# Patient Record
Sex: Female | Born: 1940 | Race: White | Hispanic: No | Marital: Married | Smoking: Never smoker
Health system: Southern US, Community
[De-identification: ages and names within clinical notes are randomized; demographics above are authoritative.]

## PROBLEM LIST (undated history)

## (undated) DIAGNOSIS — C50919 Malignant neoplasm of unspecified site of unspecified female breast: Secondary | ICD-10-CM

## (undated) DIAGNOSIS — Z923 Personal history of irradiation: Secondary | ICD-10-CM

---

## 1999-02-22 DIAGNOSIS — C50919 Malignant neoplasm of unspecified site of unspecified female breast: Secondary | ICD-10-CM

## 1999-02-22 DIAGNOSIS — Z923 Personal history of irradiation: Secondary | ICD-10-CM

## 1999-02-22 HISTORY — DX: Personal history of irradiation: Z92.3

## 1999-02-22 HISTORY — DX: Malignant neoplasm of unspecified site of unspecified female breast: C50.919

## 2003-12-08 ENCOUNTER — Ambulatory Visit: Payer: Self-pay | Admitting: Oncology

## 2004-02-02 ENCOUNTER — Ambulatory Visit: Payer: Self-pay | Admitting: Radiation Oncology

## 2004-06-07 ENCOUNTER — Ambulatory Visit: Payer: Self-pay | Admitting: Oncology

## 2004-11-15 ENCOUNTER — Ambulatory Visit: Payer: Self-pay | Admitting: Surgery

## 2004-12-01 ENCOUNTER — Ambulatory Visit: Payer: Self-pay | Admitting: Oncology

## 2005-05-30 ENCOUNTER — Ambulatory Visit: Payer: Self-pay | Admitting: Oncology

## 2005-12-12 ENCOUNTER — Ambulatory Visit: Payer: Self-pay | Admitting: Internal Medicine

## 2006-07-10 ENCOUNTER — Ambulatory Visit: Payer: Self-pay | Admitting: Specialist

## 2006-07-10 ENCOUNTER — Other Ambulatory Visit: Payer: Self-pay

## 2006-07-20 ENCOUNTER — Inpatient Hospital Stay: Payer: Self-pay | Admitting: Specialist

## 2006-07-25 ENCOUNTER — Encounter: Payer: Self-pay | Admitting: Internal Medicine

## 2007-01-25 ENCOUNTER — Ambulatory Visit: Payer: Self-pay | Admitting: Internal Medicine

## 2008-03-19 ENCOUNTER — Ambulatory Visit: Payer: Self-pay | Admitting: Internal Medicine

## 2008-09-08 ENCOUNTER — Ambulatory Visit: Payer: Self-pay | Admitting: Unknown Physician Specialty

## 2009-04-17 ENCOUNTER — Ambulatory Visit: Payer: Self-pay | Admitting: Internal Medicine

## 2010-05-25 ENCOUNTER — Ambulatory Visit: Payer: Self-pay | Admitting: Internal Medicine

## 2011-07-06 ENCOUNTER — Ambulatory Visit: Payer: Self-pay | Admitting: Internal Medicine

## 2012-07-19 ENCOUNTER — Ambulatory Visit: Payer: Self-pay | Admitting: Internal Medicine

## 2016-11-17 ENCOUNTER — Other Ambulatory Visit: Payer: Self-pay | Admitting: Family Medicine

## 2016-11-17 DIAGNOSIS — Z1231 Encounter for screening mammogram for malignant neoplasm of breast: Secondary | ICD-10-CM

## 2016-11-28 ENCOUNTER — Ambulatory Visit
Admission: RE | Admit: 2016-11-28 | Discharge: 2016-11-28 | Disposition: A | Discharge status: Home / Self Care | Payer: Medicare Other | Source: Ambulatory Visit | Attending: Family Medicine | Admitting: Family Medicine

## 2016-11-28 ENCOUNTER — Encounter: Payer: Self-pay | Admitting: Radiology

## 2016-11-28 ENCOUNTER — Other Ambulatory Visit: Payer: Self-pay | Admitting: Family Medicine

## 2016-11-28 DIAGNOSIS — Z1231 Encounter for screening mammogram for malignant neoplasm of breast: Secondary | ICD-10-CM | POA: Insufficient documentation

## 2016-11-28 HISTORY — DX: Malignant neoplasm of unspecified site of unspecified female breast: C50.919

## 2016-11-28 HISTORY — DX: Personal history of irradiation: Z92.3

## 2017-09-19 ENCOUNTER — Other Ambulatory Visit: Payer: Self-pay | Admitting: Family Medicine

## 2017-09-19 DIAGNOSIS — Z1231 Encounter for screening mammogram for malignant neoplasm of breast: Secondary | ICD-10-CM

## 2017-11-29 ENCOUNTER — Ambulatory Visit
Admission: RE | Admit: 2017-11-29 | Discharge: 2017-11-29 | Disposition: A | Discharge status: Home / Self Care | Payer: Medicare Other | Source: Ambulatory Visit | Attending: Family Medicine | Admitting: Family Medicine

## 2017-11-29 DIAGNOSIS — Z1231 Encounter for screening mammogram for malignant neoplasm of breast: Secondary | ICD-10-CM | POA: Diagnosis not present

## 2018-05-29 ENCOUNTER — Other Ambulatory Visit: Payer: Self-pay | Admitting: Acute Care

## 2018-05-29 DIAGNOSIS — R441 Visual hallucinations: Secondary | ICD-10-CM

## 2018-06-27 ENCOUNTER — Ambulatory Visit: Admission: RE | Admit: 2018-06-27 | Payer: Medicare Other | Source: Ambulatory Visit

## 2018-07-13 ENCOUNTER — Other Ambulatory Visit: Payer: Self-pay

## 2018-07-13 ENCOUNTER — Ambulatory Visit
Admission: RE | Admit: 2018-07-13 | Discharge: 2018-07-13 | Disposition: A | Discharge status: Home / Self Care | Payer: Medicare Other | Source: Ambulatory Visit | Attending: Acute Care | Admitting: Acute Care

## 2018-07-13 DIAGNOSIS — R441 Visual hallucinations: Secondary | ICD-10-CM | POA: Diagnosis present

## 2018-07-27 ENCOUNTER — Ambulatory Visit: Payer: Medicare Other

## 2018-12-30 ENCOUNTER — Emergency Department: Payer: Medicare Other

## 2018-12-30 ENCOUNTER — Other Ambulatory Visit: Payer: Self-pay

## 2018-12-30 ENCOUNTER — Encounter: Payer: Self-pay | Admitting: Emergency Medicine

## 2018-12-30 DIAGNOSIS — R443 Hallucinations, unspecified: Secondary | ICD-10-CM | POA: Diagnosis present

## 2018-12-30 DIAGNOSIS — I509 Heart failure, unspecified: Secondary | ICD-10-CM | POA: Diagnosis present

## 2018-12-30 DIAGNOSIS — J9601 Acute respiratory failure with hypoxia: Secondary | ICD-10-CM | POA: Diagnosis not present

## 2018-12-30 DIAGNOSIS — M3500 Sicca syndrome, unspecified: Secondary | ICD-10-CM | POA: Diagnosis present

## 2018-12-30 DIAGNOSIS — I228 Subsequent ST elevation (STEMI) myocardial infarction of other sites: Principal | ICD-10-CM | POA: Diagnosis present

## 2018-12-30 DIAGNOSIS — Z853 Personal history of malignant neoplasm of breast: Secondary | ICD-10-CM | POA: Diagnosis not present

## 2018-12-30 DIAGNOSIS — Z923 Personal history of irradiation: Secondary | ICD-10-CM

## 2018-12-30 DIAGNOSIS — R778 Other specified abnormalities of plasma proteins: Secondary | ICD-10-CM

## 2018-12-30 DIAGNOSIS — L8995 Pressure ulcer of unspecified site, unstageable: Secondary | ICD-10-CM | POA: Diagnosis not present

## 2018-12-30 DIAGNOSIS — J9602 Acute respiratory failure with hypercapnia: Secondary | ICD-10-CM | POA: Diagnosis not present

## 2018-12-30 DIAGNOSIS — I214 Non-ST elevation (NSTEMI) myocardial infarction: Secondary | ICD-10-CM | POA: Diagnosis not present

## 2018-12-30 DIAGNOSIS — R5381 Other malaise: Secondary | ICD-10-CM | POA: Diagnosis present

## 2018-12-30 DIAGNOSIS — Z803 Family history of malignant neoplasm of breast: Secondary | ICD-10-CM

## 2018-12-30 DIAGNOSIS — H9190 Unspecified hearing loss, unspecified ear: Secondary | ICD-10-CM | POA: Diagnosis present

## 2018-12-30 DIAGNOSIS — Z66 Do not resuscitate: Secondary | ICD-10-CM | POA: Diagnosis present

## 2018-12-30 DIAGNOSIS — M810 Age-related osteoporosis without current pathological fracture: Secondary | ICD-10-CM | POA: Diagnosis present

## 2018-12-30 DIAGNOSIS — Z20828 Contact with and (suspected) exposure to other viral communicable diseases: Secondary | ICD-10-CM | POA: Diagnosis present

## 2018-12-30 DIAGNOSIS — D500 Iron deficiency anemia secondary to blood loss (chronic): Secondary | ICD-10-CM | POA: Diagnosis present

## 2018-12-30 DIAGNOSIS — R451 Restlessness and agitation: Secondary | ICD-10-CM | POA: Diagnosis not present

## 2018-12-30 DIAGNOSIS — K921 Melena: Secondary | ICD-10-CM | POA: Diagnosis present

## 2018-12-30 DIAGNOSIS — D62 Acute posthemorrhagic anemia: Secondary | ICD-10-CM | POA: Diagnosis not present

## 2018-12-30 DIAGNOSIS — E876 Hypokalemia: Secondary | ICD-10-CM | POA: Diagnosis present

## 2018-12-30 DIAGNOSIS — K922 Gastrointestinal hemorrhage, unspecified: Secondary | ICD-10-CM | POA: Diagnosis not present

## 2018-12-30 DIAGNOSIS — R531 Weakness: Secondary | ICD-10-CM

## 2018-12-30 DIAGNOSIS — R410 Disorientation, unspecified: Secondary | ICD-10-CM

## 2018-12-30 DIAGNOSIS — I469 Cardiac arrest, cause unspecified: Secondary | ICD-10-CM | POA: Diagnosis not present

## 2018-12-30 DIAGNOSIS — L8990 Pressure ulcer of unspecified site, unspecified stage: Secondary | ICD-10-CM | POA: Diagnosis present

## 2018-12-30 LAB — RETICULOCYTES
Immature Retic Fract: 37.8 % — ABNORMAL HIGH (ref 2.3–15.9)
RBC.: 3.04 MIL/uL — ABNORMAL LOW (ref 3.87–5.11)
Retic Count, Absolute: 64.8 10*3/uL (ref 19.0–186.0)
Retic Ct Pct: 2.1 % (ref 0.4–3.1)

## 2018-12-30 LAB — BRAIN NATRIURETIC PEPTIDE: B Natriuretic Peptide: 1658 pg/mL — ABNORMAL HIGH (ref 0.0–100.0)

## 2018-12-30 LAB — COMPREHENSIVE METABOLIC PANEL
ALT: 18 U/L (ref 0–44)
AST: 78 U/L — ABNORMAL HIGH (ref 15–41)
Albumin: 2.7 g/dL — ABNORMAL LOW (ref 3.5–5.0)
Alkaline Phosphatase: 59 U/L (ref 38–126)
Anion gap: 13 (ref 5–15)
BUN: 24 mg/dL — ABNORMAL HIGH (ref 8–23)
CO2: 25 mmol/L (ref 22–32)
Calcium: 8.3 mg/dL — ABNORMAL LOW (ref 8.9–10.3)
Chloride: 97 mmol/L — ABNORMAL LOW (ref 98–111)
Creatinine, Ser: 0.98 mg/dL (ref 0.44–1.00)
GFR calc Af Amer: >60 mL/min (ref >60)
GFR calc non Af Amer: 55 mL/min — ABNORMAL LOW (ref >60)
Glucose, Bld: 75 mg/dL (ref 70–99)
Potassium: 2.1 mmol/L — CL (ref 3.5–5.1)
Sodium: 135 mmol/L (ref 135–145)
Total Bilirubin: 1.1 mg/dL (ref 0.3–1.2)
Total Protein: 7.4 g/dL (ref 6.5–8.1)

## 2018-12-30 LAB — URINALYSIS, COMPLETE (UACMP) WITH MICROSCOPIC
Bacteria, UA: NONE SEEN
Bilirubin Urine: NEGATIVE
Glucose, UA: NEGATIVE mg/dL
Hgb urine dipstick: NEGATIVE
Ketones, ur: NEGATIVE mg/dL
Leukocytes,Ua: NEGATIVE
Nitrite: NEGATIVE
Protein, ur: NEGATIVE mg/dL
Specific Gravity, Urine: 1.018 (ref 1.005–1.030)
pH: 5 (ref 5.0–8.0)

## 2018-12-30 LAB — CBC WITH DIFFERENTIAL/PLATELET
Abs Immature Granulocytes: 0.14 10*3/uL — ABNORMAL HIGH (ref 0.00–0.07)
Basophils Absolute: 0 10*3/uL (ref 0.0–0.1)
Basophils Relative: 0 %
Eosinophils Absolute: 0 10*3/uL (ref 0.0–0.5)
Eosinophils Relative: 0 %
HCT: 21.5 % — ABNORMAL LOW (ref 36.0–46.0)
Hemoglobin: 6 g/dL — ABNORMAL LOW (ref 12.0–15.0)
Immature Granulocytes: 1 %
Lymphocytes Relative: 6 %
Lymphs Abs: 1 10*3/uL (ref 0.7–4.0)
MCH: 19.2 pg — ABNORMAL LOW (ref 26.0–34.0)
MCHC: 27.9 g/dL — ABNORMAL LOW (ref 30.0–36.0)
MCV: 68.9 fL — ABNORMAL LOW (ref 80.0–100.0)
Monocytes Absolute: 1 10*3/uL (ref 0.1–1.0)
Monocytes Relative: 6 %
Neutro Abs: 14.3 10*3/uL — ABNORMAL HIGH (ref 1.7–7.7)
Neutrophils Relative %: 87 %
Platelets: 165 10*3/uL (ref 150–400)
RBC: 3.12 MIL/uL — ABNORMAL LOW (ref 3.87–5.11)
RDW: 22.4 % — ABNORMAL HIGH (ref 11.5–15.5)
Smear Review: NORMAL
WBC: 16.4 10*3/uL — ABNORMAL HIGH (ref 4.0–10.5)
nRBC: 0.4 % — ABNORMAL HIGH (ref 0.0–0.2)

## 2018-12-30 LAB — PREPARE RBC (CROSSMATCH)

## 2018-12-30 LAB — IRON AND TIBC
Iron: 16 ug/dL — ABNORMAL LOW (ref 28–170)
Saturation Ratios: 5 % — ABNORMAL LOW (ref 10.4–31.8)
TIBC: 312 ug/dL (ref 250–450)
UIBC: 296 ug/dL

## 2018-12-30 LAB — SARS CORONAVIRUS 2 (TAT 6-24 HRS): SARS Coronavirus 2: NEGATIVE

## 2018-12-30 LAB — LACTATE DEHYDROGENASE: LDH: 489 U/L — ABNORMAL HIGH (ref 98–192)

## 2018-12-30 LAB — AMMONIA: Ammonia: 17 umol/L (ref 9–35)

## 2018-12-30 LAB — TECHNOLOGIST SMEAR REVIEW: Plt Morphology: NORMAL

## 2018-12-30 LAB — FIBRINOGEN: Fibrinogen: 323 mg/dL (ref 210–475)

## 2018-12-30 LAB — PROTIME-INR
INR: 1.4 — ABNORMAL HIGH (ref 0.8–1.2)
Prothrombin Time: 17.1 seconds — ABNORMAL HIGH (ref 11.4–15.2)

## 2018-12-30 LAB — MAGNESIUM: Magnesium: 1.9 mg/dL (ref 1.7–2.4)

## 2018-12-30 LAB — LIPASE, BLOOD: Lipase: 45 U/L (ref 11–51)

## 2018-12-30 LAB — ABO/RH: ABO/RH(D): A POS

## 2018-12-30 LAB — FERRITIN: Ferritin: 18 ng/mL (ref 11–307)

## 2018-12-30 LAB — APTT: aPTT: 28 seconds (ref 24–36)

## 2018-12-30 LAB — TROPONIN I (HIGH SENSITIVITY): Troponin I (High Sensitivity): 12952 ng/L (ref <18)

## 2018-12-30 LAB — TSH: TSH: 0.648 u[IU]/mL (ref 0.350–4.500)

## 2018-12-30 LAB — VITAMIN B12: Vitamin B-12: 848 pg/mL (ref 180–914)

## 2018-12-30 MED ORDER — SODIUM CHLORIDE 0.9 % IV SOLN
510.0000 mg | Freq: Once | INTRAVENOUS | Status: AC
  Administered 2018-12-30: 510 mg via INTRAVENOUS
  Filled 2018-12-30: qty 17

## 2018-12-30 MED ORDER — ACETAMINOPHEN 325 MG PO TABS
650.0000 mg | ORAL_TABLET | Freq: Four times a day (QID) | ORAL | Status: DC | PRN
  Administered 2018-12-30: 650 mg via ORAL
  Filled 2018-12-30: qty 2

## 2018-12-30 MED ORDER — QUETIAPINE FUMARATE 25 MG PO TABS
25.0000 mg | ORAL_TABLET | Freq: Every day | ORAL | Status: DC
  Administered 2018-12-30 – 2018-12-31 (×2): 25 mg via ORAL
  Filled 2018-12-30 (×3): qty 1

## 2018-12-30 MED ORDER — ONDANSETRON HCL 4 MG PO TABS
4.0000 mg | ORAL_TABLET | Freq: Four times a day (QID) | ORAL | Status: DC | PRN
  Filled 2018-12-30: qty 1

## 2018-12-30 MED ORDER — SODIUM CHLORIDE 0.9 % IV SOLN
80.0000 mg | Freq: Once | INTRAVENOUS | Status: AC
  Administered 2018-12-30: 80 mg via INTRAVENOUS
  Filled 2018-12-30: qty 80

## 2018-12-30 MED ORDER — TRAZODONE HCL 50 MG PO TABS
25.0000 mg | ORAL_TABLET | Freq: Every evening | ORAL | Status: DC | PRN
  Administered 2018-12-30 – 2018-12-31 (×2): 25 mg via ORAL
  Filled 2018-12-30: qty 1
  Filled 2018-12-30: qty 0.5
  Filled 2018-12-30: qty 1

## 2018-12-30 MED ORDER — POTASSIUM CHLORIDE 10 MEQ/100ML IV SOLN
10.0000 meq | INTRAVENOUS | Status: AC
  Administered 2018-12-30 (×4): 10 meq via INTRAVENOUS
  Filled 2018-12-30 (×4): qty 100

## 2018-12-30 MED ORDER — SODIUM CHLORIDE 0.9% IV SOLUTION
Freq: Once | INTRAVENOUS | Status: AC
  Administered 2018-12-30: 14:00:00 via INTRAVENOUS
  Filled 2018-12-30: qty 250

## 2018-12-30 MED ORDER — PANTOPRAZOLE SODIUM 40 MG IV SOLR: 40.0000 mg | Freq: Once | INTRAVENOUS | Status: DC

## 2018-12-30 MED ORDER — ONDANSETRON HCL 4 MG/2ML IJ SOLN: 4.0000 mg | Freq: Four times a day (QID) | INTRAMUSCULAR | Status: DC | PRN

## 2018-12-30 MED ORDER — POTASSIUM CHLORIDE CRYS ER 20 MEQ PO TBCR
40.0000 meq | EXTENDED_RELEASE_TABLET | Freq: Once | ORAL | Status: AC
  Administered 2018-12-30: 40 meq via ORAL
  Filled 2018-12-30: qty 2

## 2018-12-30 MED ORDER — PANTOPRAZOLE SODIUM 40 MG IV SOLR
40.0000 mg | Freq: Two times a day (BID) | INTRAVENOUS | Status: DC
  Administered 2018-12-30 – 2019-01-01 (×4): 40 mg via INTRAVENOUS
  Filled 2018-12-30 (×6): qty 40

## 2018-12-30 MED ORDER — ACETAMINOPHEN 650 MG RE SUPP: 650.0000 mg | Freq: Four times a day (QID) | RECTAL | Status: DC | PRN

## 2018-12-30 MED ORDER — FENTANYL CITRATE (PF) 100 MCG/2ML IJ SOLN
25.0000 ug | Freq: Once | INTRAMUSCULAR | Status: AC
  Administered 2018-12-30: 25 ug via INTRAVENOUS
  Filled 2018-12-30: qty 2

## 2018-12-30 NOTE — ED Notes (Signed)
Due to pt not being able to handle potassium previously, new bag just now started. Pt getting NS at same rate as potassium. Pt tolerating well at this time.

## 2018-12-30 NOTE — ED Notes (Signed)
Pt left AC IV leaking- cleaned around the site and replaced tegaderm- IV repositioned and no longer leaking

## 2018-12-30 NOTE — ED Notes (Signed)
Pt currently alert and oriented x3. Pt slow to answer questions at this time.

## 2018-12-30 NOTE — H&P (Signed)
[facility] History and Physical   Patient: Wendy Sutton [id number]   PCP: Default, Provider, MD DOB: Jun 24, 1940   DOA: 01/15/2019   DOS: 01/15/2019   DOS: the patient was seen and examined on 12/28/2018  Patient coming from: The patient is coming from Home  Chief Complaint:   HPI: Wendy Sutton is a 78 y.o. female with Past medical history of Sjogren's syndrome, neuropathy, hallucination. Patient presented with complaints of 1 week of poor p.o. intake fatigue and tiredness. History was taken from husband.  Patient does have some cognitive decline over last 1 year but since last 1 week patient is more confused. There was also significant weakness and pallor reported by the husband. No diarrhea reported no constipation no nausea no vomiting.  No fever no chills.  No change in the medication reported. No melena no BRBPR reported. Patient does not take any ibuprofen Aleve naproxen. Patient denies any chest pain chest tightness chest heaviness.  No shortness of breath as well when laying flat.  ED Course: Presented with above complaint.  Hemoglobin was significantly low.  Patient was started on 2 PRBC and was referred for further admission.  At her baseline with assistance independent for most of her ADL;  manages her medication on her own.  Review of Systems: as mentioned in the history of present illness.  All other systems reviewed and are negative.  Past Medical History:  Diagnosis Date  . Breast cancer ([place]) 2001   right breast cancer  . Personal history of radiation therapy 2001   History reviewed. No pertinent surgical history. Social History:  has no history on file for tobacco, alcohol, and drug.  Allergies  Allergen Reactions  . Levonorgestrel-Ethinyl Estrad Hives and Rash    Family history reviewed and not pertinent Family History  Problem Relation Age of Onset  . Breast cancer Sister 3     Prior to Admission medications   Medication Sig Start  Date End Date Taking? Authorizing Provider  alendronate (FOSAMAX) 70 MG tablet Take 70 mg by mouth once a week. 12/22/18  Yes [provider]  calcium carbonate (OS-CAL - DOSED IN MG OF ELEMENTAL CALCIUM) 1250 (500 Ca) MG tablet Take 1 tablet by mouth daily.   Yes [provider]  hydroxychloroquine (PLAQUENIL) 200 MG tablet Take 200 mg by mouth daily. 06/18/18  Yes [provider]  loratadine (CLARITIN) 10 MG tablet Take 10 mg by mouth daily.   Yes [provider]  QUEtiapine (SEROQUEL) 25 MG tablet Take 25 mg by mouth at bedtime. 10/04/18  Yes [provider]    Physical Exam: Vitals:   12/29/2018 1845 01/18/2019 1900 01/15/2019 1915 12/31/2018 1930  BP: (!) 141/82 123/66 (!) 119/102 133/60  Pulse: 84 82 89 80  Resp: (!) 24 (!) 22 (!) 26 (!) 23  Temp:      TempSrc:      SpO2: 100% 96% 97% 97%  Weight:      Height:        General: alert and oriented to time and place. Appear in mild distress, affect flat in affect Eyes: PERRL, Conjunctiva shows pallor ENT: Oral Mucosa shows Poor dentition  Neck: no JVD, no Abnormal Mass Or lumps Cardiovascular: S1 and S2 Present, no Murmur, peripheral pulses symmetrical Respiratory: good respiratory effort, Bilateral Air entry equal and Decreased, no signs of accessory muscle use, Clear to Auscultation, no Crackles, no wheezes Abdomen: Bowel Sound present, Soft and no tenderness, no hernia Skin: no rashes  Extremities: no Pedal edema, no calf tenderness Neurologic: without any new focal findings Gait not checked due to patient safety concerns  Data Reviewed: I have personally reviewed and interpreted labs, imaging as discussed below.  CBC: Recent Labs  Lab 01/11/2019 1017  WBC 16.4*  NEUTROABS 14.3*  HGB 6.0*  HCT 21.5*  MCV 68.9*  PLT 123XX123   Basic Metabolic Panel: Recent Labs  Lab 01/05/2019 1017 01/14/2019 1303  NA 135  --   K 2.1*  --   CL 97*  --   CO2 25  --   GLUCOSE 75  --   BUN 24*  --    CREATININE 0.98  --   CALCIUM 8.3*  --   MG  --  1.9   GFR: Estimated Creatinine Clearance: 46.5 mL/min (by C-G formula based on SCr of 0.98 mg/dL). Liver Function Tests: Recent Labs  Lab 01/12/2019 1017  AST 78*  ALT 18  ALKPHOS 59  BILITOT 1.1  PROT 7.4  ALBUMIN 2.7*   Recent Labs  Lab 01/11/2019 1017  LIPASE 45   Recent Labs  Lab 01/20/2019 1303  AMMONIA 17   Coagulation Profile: Recent Labs  Lab 01/13/2019 1303  INR 1.4*   Cardiac Enzymes: No results for input(s): CKTOTAL, CKMB, CKMBINDEX, TROPONINI in the last 168 hours. BNP (last 3 results) No results for input(s): PROBNP in the last 8760 hours. HbA1C: No results for input(s): HGBA1C in the last 72 hours. CBG: No results for input(s): GLUCAP in the last 168 hours. Lipid Profile: No results for input(s): CHOL, HDL, LDLCALC, TRIG, CHOLHDL, LDLDIRECT in the last 72 hours. Thyroid Function Tests: Recent Labs    12/24/2018 1303  TSH 0.648   Anemia Panel: Recent Labs    12/29/2018 1303  VITAMINB12 848  FERRITIN 18  TIBC 312  IRON 16*  RETICCTPCT 2.1   Urine analysis:    Component Value Date/Time   COLORURINE YELLOW (A) 01/11/2019 1146   APPEARANCEUR CLEAR (A) 12/25/2018 1146   LABSPEC 1.018 01/03/2019 1146   PHURINE 5.0 01/16/2019 1146   GLUCOSEU NEGATIVE 01/11/2019 1146   HGBUR NEGATIVE 01/11/2019 1146   BILIRUBINUR NEGATIVE 01/20/2019 1146   KETONESUR NEGATIVE 01/10/2019 1146   PROTEINUR NEGATIVE 01/05/2019 1146   NITRITE NEGATIVE 01/17/2019 1146   LEUKOCYTESUR NEGATIVE 01/07/2019 1146    Radiological Exams on Admission: Ct Head Wo Contrast  Result Date: 12/31/2018 CLINICAL DATA:  Confusion, weakness EXAM: CT HEAD WITHOUT CONTRAST TECHNIQUE: Contiguous axial images were obtained from the base of the skull through the vertex without intravenous contrast. COMPARISON:  MRI brain dated 07/14/2018 FINDINGS: Brain: No evidence of acute infarction, hemorrhage, hydrocephalus, extra-axial collection or mass  lesion/mass effect. Subcortical white matter and periventricular small vessel ischemic changes. Vascular: Intracranial atherosclerosis. Skull: Normal. Negative for fracture or focal lesion. Sinuses/Orbits: The visualized paranasal sinuses are essentially clear. The mastoid air cells are unopacified. Other: None. IMPRESSION: No evidence of acute intracranial abnormality. Small vessel ischemic changes. Electronically Signed   By: Julian Hy M.D.   On: 12/28/2018 12:15   Dg Chest Port 1 View  Result Date: 01/17/2019 CLINICAL DATA:  Patient with altered mental status. EXAM: PORTABLE CHEST 1 VIEW COMPARISON:  None. FINDINGS: Monitoring leads overlie the patient. Enlarged cardiac and mediastinal contours. Aortic atherosclerosis. Small bilateral pleural effusions. Bilateral interstitial pulmonary opacities. IMPRESSION: Cardiomegaly with interstitial opacities favored to represent edema. Small bilateral effusions. Electronically Signed   By: Lovey Newcomer M.D.   On: 12/23/2018 11:20   EKG: Independently reviewed.  normal sinus rhythm, nonspecific ST and T waves changes, Q waves in lateral leads. Echocardiogram: Ordered  I reviewed all nursing notes, pharmacy notes, vitals, pertinent old records.  Assessment/Plan 1.  Non-STEMI Likely demand ischemia from anemia. Although troponin significantly elevated. Unable to anticoagulate given her anemia. No indication for acute intervention for now. Monitor in stepdown unit. Echocardiogram ordered. Unable to provide antiplatelet medications as well.  2.  Symptomatic anemia. Generalized fatigue and tiredness and pallor. Hemoglobin significantly low. Family denies any significant GI bleed at home. Patient does not have any abdominal pain. Hemoccult was positive with brown stool. Patient is currently receiving 2 PRBC. Maintain H&H above 8. We will provide current Protonix for now. If the H&H remains elevated appropriately no further work-up inpatient but  may require outpatient work-up with GI.  3.  Iron deficiency. We will provide IV Feraheme. Oral iron starting tomorrow.  4.  Hallucination. Patient is on Seroquel at home which I will continue.  5. Sjogren syndrome prevention patient is on hydroxychloroquine which I will hold for now given her significant anemia. Can resume once H&H is relatively stable.   Nutrition: Cardiac diet DVT Prophylaxis: SCD, pharmacological prophylaxis contraindicated due to Anemia  Advance goals of care discussion: DNR   Consults: EDP personally Discussed with cardiology  Family Communication: family was present at bedside, at the time of interview.  Opportunity was given to ask question and all questions were answered satisfactorily.  Disposition: Admitted as inpatient, step-down unit. Likely to be discharged hopme, in 3 days.  I have discussed plan of care as described above with RN and patient/family.  Author: Berle Mull, MD [facility] 12/24/2018 8:45 PM   To reach On-call, see care teams to locate the attending and reach out to them via [email or web address]. If 7PM-7AM, please contact night-coverage If you still have difficulty reaching the attending provider, please page the [facility] (Director on Call) for [facility] on amion for assistance.

## 2018-12-30 NOTE — ED Triage Notes (Signed)
Pt presents from home via acems with c/o weakness for 2 days. Pt normally ambulatory with walker per husband. Pt has not been ambulatory for past 2 days. vomited earlier this week,none today. Pt has altered mental status that is not baseline according to husband.

## 2018-12-30 NOTE — ED Notes (Signed)
  Pt transported to ct 

## 2018-12-30 NOTE — Consult Note (Signed)
[facility] Cardiology  CARDIOLOGY CONSULT NOTE  Patient ID: Wendy Sutton MRN: [id number] DOB/AGE: Sep 28, 1940 78 y.o.  Admit date: 01/03/2019 Referring Physician Posey Pronto Primary Physician [facility] Primary Cardiologist  Reason for Consultation lateral STEMI  HPI: 78 year old female referred for evaluation of lateral ST elevation myocardial infarction.  Patient was brought to [facility] ED with several day history of generalized weakness, worsening confusion, difficulty with ambulation, nausea with vomiting, decreased appetite.  Patient also complains of several week history of mid epigastric burning.  ECG reveals 1 mm of ST elevation in leads I and aVL with diagnostic Q waves.  Initial troponin 12,952.  Admission labs also notable for marked hypokalemia with potassium of 2.1, and marked anemia with hemoglobin hematocrit of 6.0 and 21.5, respectively.  Patient's husband reports that he saw bright red blood in the toilet.  Most recent hemoglobin hematocrit were 12.2 and 37.7 on 08/15/2017.  Review of systems complete and found to be negative unless listed above     Past Medical History:  Diagnosis Date  . Breast cancer ([place]) 2001   right breast cancer  . Personal history of radiation therapy 2001    History reviewed. No pertinent surgical history.  (Not in a hospital admission)  Social History   Socioeconomic History  . Marital status: Married    Spouse name: Not on file  . Number of children: Not on file  . Years of education: Not on file  . Highest education level: Not on file  Occupational History  . Not on file  Social Needs  . Financial resource strain: Not on file  . Food insecurity    Worry: Not on file    Inability: Not on file  . Transportation needs    Medical: Not on file    Non-medical: Not on file  Tobacco Use  . Smoking status: Not on file  Substance and Sexual Activity  . Alcohol use: Not on file  . Drug use: Not on file  . Sexual activity: Not on file  Lifestyle  .  Physical activity    Days per week: Not on file    Minutes per session: Not on file  . Stress: Not on file  Relationships  . Social Herbalist on phone: Not on file    Gets together: Not on file    Attends religious service: Not on file    Active member of club or organization: Not on file    Attends meetings of clubs or organizations: Not on file    Relationship status: Not on file  . Intimate partner violence    Fear of current or ex partner: Not on file    Emotionally abused: Not on file    Physically abused: Not on file    Forced sexual activity: Not on file  Other Topics Concern  . Not on file  Social History Narrative  . Not on file    Family History  Problem Relation Age of Onset  . Breast cancer Sister 78      Review of systems complete and found to be negative unless listed above      PHYSICAL EXAM  General: Well developed, well nourished, in no acute distress HEENT:  Normocephalic and atramatic Neck:  No JVD.  Lungs: Clear bilaterally to auscultation and percussion. Heart: HRRR . Normal S1 and S2 without gallops or murmurs.  Abdomen: Bowel sounds are positive, abdomen soft and non-tender  Msk:  Back normal, normal gait. Normal strength and tone  for age. Extremities: No clubbing, cyanosis or edema.   Neuro: Alert and oriented X 3. Psych:  Good affect, responds appropriately  Labs:   Lab Results  Component Value Date   WBC 16.4 (H) 01/17/2019   HGB 6.0 (L) 12/27/2018   HCT 21.5 (L) 01/05/2019   MCV 68.9 (L) 01/02/2019   PLT 165 12/31/2018    Recent Labs  Lab 12/27/2018 1017  NA 135  K 2.1*  CL 97*  CO2 25  BUN 24*  CREATININE 0.98  CALCIUM 8.3*  PROT 7.4  BILITOT 1.1  ALKPHOS 59  ALT 18  AST 78*  GLUCOSE 75   No results found for: CKTOTAL, CKMB, CKMBINDEX, TROPONINI No results found for: CHOL No results found for: HDL No results found for: LDLCALC No results found for: TRIG No results found for: CHOLHDL No results found  for: LDLDIRECT    Radiology: Dg Chest Port 1 View  Result Date: 01/12/2019 CLINICAL DATA:  Patient with altered mental status. EXAM: PORTABLE CHEST 1 VIEW COMPARISON:  None. FINDINGS: Monitoring leads overlie the patient. Enlarged cardiac and mediastinal contours. Aortic atherosclerosis. Small bilateral pleural effusions. Bilateral interstitial pulmonary opacities. IMPRESSION: Cardiomegaly with interstitial opacities favored to represent edema. Small bilateral effusions. Electronically Signed   By: Lovey Newcomer M.D.   On: 12/28/2018 11:20    EKG: Sinus rhythm with Q waves and 1 mm ST elevation in leads I and aVL  ASSESSMENT AND PLAN:   1.  Lateral ST elevation myocardial infarction, probable late presentation, with initial high-sensitivity troponin 12,952, with diagnostic Q waves in leads I and aVL, in the absence of chest pain, in the setting of marked anemia 2.  Marked anemia, of unknown etiology 3.  Generalized weakness/decreased mental status/poor p.o. intake  Recommendations  1.  Agree with overall current therapy 2.  Would defer emergent cardiac catheterization with late presentation, lack of chest pain, in the setting of marked anemia.  The risk of cardiac catheterization and possible PCI would likely outweigh the benefits at this time. 3.  Defer full dose anticoagulation 4.  Gentle rehydration 5.  Would recommend transfusion 1 to 2 units PBRCs 6.  2D echocardiogram 7.  Further recommendations pending patient's initial clinical course and 2D echocardiogram results  Signed: Isaias Cowman MD,PhD, [facility] 12/24/2018, 12:56 PM

## 2018-12-30 NOTE — ED Provider Notes (Addendum)
[facility] Emergency Department Provider Note  ____________________________________________   First MD Initiated Contact with Patient 12/27/2018 1014     (approximate)  I have reviewed the triage vital signs and the nursing notes.  History  Chief Complaint Weakness    HPI Wendy Sutton is a 78 y.o. female with history of neuropathy, osteoporosis, Sjogren's who presents for generalized weakness, confusion above baseline.  Per the husband, the patient has had some general cognitive decline over the last year.  He feels like over the last several weeks she has become more confused than normal.  She normally ambulates with a walker, and today she was too weak to even stand independently, prompting evaluation.  At present, the patient denies any acute complaints.  She specifically denies any chest pain, shortness of breath, nausea, vomiting.  Husband does note some intermittent complaints of upper abdominal pain over the last several weeks.  She has occasionally complained of nausea, but he states that this seemed to improve her the last week or so. He does note decreased PO over the last few days and some intermittent vomiting.    [place] has noticed some increased leg swelling over the last month to month and a half.  He denies any cardiac history.  He does report seeing some blood in the toilet today, unsure if this was from her hemorrhoid.  He denies any prior issues with bleeding.  She is not on any anticoagulation and he denies any heavy NSAID use.  Caveat: History primarily obtained from husband due to patient's confusion.   Past Medical Hx Past Medical History:  Diagnosis Date  . Breast cancer ([place]) 2001   right breast cancer  . Personal history of radiation therapy 2001    Problem List There are no active problems to display for this patient.   Past Surgical Hx History reviewed. No pertinent surgical history.  Medications Prior to  Admission medications   Not on File    Allergies Patient has no known allergies.  Family Hx Family History  Problem Relation Age of Onset  . Breast cancer Sister 35    Social Hx Social History   Tobacco Use  . Smoking status: Not on file  Substance Use Topics  . Alcohol use: Not on file  . Drug use: Not on file     Review of Systems  Constitutional: + generalized weakness Eyes: Negative for visual changes. ENT: Negative for sore throat. Cardiovascular: Negative for chest pain. Respiratory: Negative for shortness of breath. Gastrointestinal: Negative for nausea, vomiting.  Genitourinary: Negative for dysuria. Musculoskeletal: + for leg swelling. Skin: Negative for rash. Neurological: Negative for for headaches.   Physical Exam  Vital Signs: ED Triage Vitals  Enc Vitals Group     BP 01/03/2019 1018 (!) 129/59     Pulse --      Resp --      Temp 01/12/2019 1020 98.3 F (36.8 C)     Temp Source 01/15/2019 1020 Oral     SpO2 --      Weight 01/18/2019 1014 170 lb (77.1 kg)     Height 01/13/2019 1014 5\' 3"  (1.6 m)     Head Circumference --      Peak Flow --      Pain Score 01/13/2019 1014 0     Pain Loc --      Pain Edu? --      Excl. in [place]? --     Constitutional: Awake and alert.  Oriented to self and place.  States her age is 47.  Unable to state time/date. Repeatedly talking about her knee replacements.  Head: Normocephalic. Atraumatic. Eyes: Conjunctivae pale. Nose: No congestion. No rhinorrhea. Mouth/Throat: Mucous membranes are slightly dry.  Neck: No stridor.   Cardiovascular: Normal rate, regular rhythm. Extremities well perfused. Respiratory: Normal respiratory effort.   Gastrointestinal: Soft. Non-tender. Non-distended.  Rectal: RN chaperone present.  Brown stool mixed with bright red blood, immediately guaiac positive. Musculoskeletal: BLE edema to mid shin, symmetric. Neurologic:  No gross focal neurologic deficits are appreciated.  No lateralizing  signs. Skin: Skin is warm, dry and intact. No rash noted. Psychiatric: Mood and affect are appropriate for situation.  EKG  Personally reviewed.   Rate: 72 Rhythm: sinus Axis: normal, borderline leftward Intervals: QRS 113 ms Q waves in 1, aVL, V2 with ~1 mm ST elevation Questionable U waves PAC Supraventricular premature contraction No STEMI, but concern for recent MI   Radiology  XR: IMPRESSION:  Cardiomegaly with interstitial opacities favored to represent edema.  Small bilateral effusions.    Procedures  Procedure(s) performed (including critical care):  .Critical Care Performed by: Lilia Pro., MD Authorized by: Lilia Pro., MD   Critical care provider statement:    Critical care time (minutes):  66   Critical care was necessary to treat or prevent imminent or life-threatening deterioration of the following conditions: NSTEMI, GIB req transfusion, hypoK, new onset HF.   Critical care was time spent personally by me on the following activities:  Discussions with consultants, evaluation of patient's response to treatment, examination of patient, ordering and performing treatments and interventions, ordering and review of laboratory studies, ordering and review of radiographic studies, pulse oximetry, re-evaluation of patient's condition, obtaining history from patient or surrogate and review of old charts     Initial Impression / Assessment and Plan / ED Course  78 y.o. female who presents to the ED for generalized weakness, unable to walk independently with her walker, worsening confusion above baseline.  Ddx: based on exam, c/f new onset HF perhaps precipitated by recent MI, also consider infection, electrolyte abnormality, anemia (guaiac positive), progressive cognitive decline  Plan for labs, EKG, imaging.  EKG with Q waves in 1, aVL, V2 with ~1 mm ST elevation. Troponin 12,000 and BNP 1600. Concerning for possible recent MI and resultant HF.  Also  hypokalemic to 2.1, consistent with U waves on EKG.   Patient also noted to be anemic to 6.0.  She is guaiac positive on exam. Will give PPI and husband provided consent for transfusion given her confusion.  Discussed case with cardiology, who agrees with likely recent MI.  Given her significant anemia, she would not be a candidate for cath lab or anticoagulation at this time.  She would however benefit from transfusion, as planned.  Plan for transfusion of 2 units.  Given her likely accompanying heart failure, will plan for slow infusion of blood.  Unfortunately, unable to administer IV Lasix between units due to her hypokalemia which we will replete.  Discussed with hospitalist for admission.   Final Clinical Impression(s) / ED Diagnosis  Final diagnoses:  Generalized weakness  Confusion  Elevated troponin  Anemia due to GI blood loss  Hypokalemia  NSTEMI (non-ST elevated myocardial infarction) [facility])  New onset of congestive heart failure ([place])       Note:  This document was prepared using Dragon voice recognition software and may include unintentional dictation errors.     Derrell Lolling  L., MD 12/23/2018 1333

## 2018-12-31 ENCOUNTER — Inpatient Hospital Stay
Admit: 2018-12-31 | Discharge: 2018-12-31 | Disposition: A | Discharge status: Home / Self Care | Payer: Medicare Other | Attending: Physician Assistant | Admitting: Physician Assistant

## 2018-12-31 DIAGNOSIS — D62 Acute posthemorrhagic anemia: Secondary | ICD-10-CM

## 2018-12-31 DIAGNOSIS — M3500 Sicca syndrome, unspecified: Secondary | ICD-10-CM

## 2018-12-31 DIAGNOSIS — R443 Hallucinations, unspecified: Secondary | ICD-10-CM

## 2018-12-31 DIAGNOSIS — D500 Iron deficiency anemia secondary to blood loss (chronic): Secondary | ICD-10-CM

## 2018-12-31 LAB — TYPE AND SCREEN
ABO/RH(D): A POS
Antibody Screen: NEGATIVE
Unit division: 0
Unit division: 0

## 2018-12-31 LAB — BPAM RBC
Blood Product Expiration Date: 202012082359
Blood Product Expiration Date: 202012082359
ISSUE DATE / TIME: 202011081412
ISSUE DATE / TIME: 202011081726
Unit Type and Rh: 6200
Unit Type and Rh: 6200

## 2018-12-31 LAB — CBC
HCT: 25.1 % — ABNORMAL LOW (ref 36.0–46.0)
Hemoglobin: 8 g/dL — ABNORMAL LOW (ref 12.0–15.0)
MCH: 22.9 pg — ABNORMAL LOW (ref 26.0–34.0)
MCHC: 31.9 g/dL (ref 30.0–36.0)
MCV: 71.7 fL — ABNORMAL LOW (ref 80.0–100.0)
Platelets: 92 10*3/uL — ABNORMAL LOW (ref 150–400)
RBC: 3.5 MIL/uL — ABNORMAL LOW (ref 3.87–5.11)
RDW: 24 % — ABNORMAL HIGH (ref 11.5–15.5)
WBC: 12 10*3/uL — ABNORMAL HIGH (ref 4.0–10.5)
nRBC: 0.6 % — ABNORMAL HIGH (ref 0.0–0.2)

## 2018-12-31 LAB — COMPREHENSIVE METABOLIC PANEL
ALT: 16 U/L (ref 0–44)
AST: 53 U/L — ABNORMAL HIGH (ref 15–41)
Albumin: 2.4 g/dL — ABNORMAL LOW (ref 3.5–5.0)
Alkaline Phosphatase: 51 U/L (ref 38–126)
Anion gap: 11 (ref 5–15)
BUN: 25 mg/dL — ABNORMAL HIGH (ref 8–23)
CO2: 22 mmol/L (ref 22–32)
Calcium: 7.5 mg/dL — ABNORMAL LOW (ref 8.9–10.3)
Chloride: 99 mmol/L (ref 98–111)
Creatinine, Ser: 0.92 mg/dL (ref 0.44–1.00)
GFR calc Af Amer: >60 mL/min (ref >60)
GFR calc non Af Amer: 60 mL/min — ABNORMAL LOW (ref >60)
Glucose, Bld: 74 mg/dL (ref 70–99)
Potassium: 3.1 mmol/L — ABNORMAL LOW (ref 3.5–5.1)
Sodium: 132 mmol/L — ABNORMAL LOW (ref 135–145)
Total Bilirubin: 1.8 mg/dL — ABNORMAL HIGH (ref 0.3–1.2)
Total Protein: 6.5 g/dL (ref 6.5–8.1)

## 2018-12-31 LAB — MAGNESIUM: Magnesium: 2.1 mg/dL (ref 1.7–2.4)

## 2018-12-31 LAB — TROPONIN I (HIGH SENSITIVITY)
Troponin I (High Sensitivity): 12484 ng/L (ref <18)
Troponin I (High Sensitivity): 10924 ng/L (ref <18)

## 2018-12-31 MED ORDER — POTASSIUM CHLORIDE 10 MEQ/100ML IV SOLN
10.0000 meq | INTRAVENOUS | Status: AC
  Administered 2018-12-31 (×4): 10 meq via INTRAVENOUS
  Filled 2018-12-31 (×4): qty 100

## 2018-12-31 MED ORDER — FUROSEMIDE 10 MG/ML IJ SOLN
20.0000 mg | Freq: Every day | INTRAMUSCULAR | Status: DC
  Administered 2018-12-31 – 2019-01-01 (×2): 20 mg via INTRAVENOUS
  Filled 2018-12-31: qty 4
  Filled 2018-12-31: qty 2

## 2018-12-31 NOTE — ED Notes (Addendum)
Pt called out wanting O2 cannula taken out of nose. This tech explained to pt that she is to keep the cannula on. Pt husband at bedside.

## 2018-12-31 NOTE — ED Notes (Signed)
In to answer pt's call bell; thermostat adjusted to warmer as requested

## 2018-12-31 NOTE — ED Notes (Signed)
Pt given more warm blankets and in hospital bed at this time.

## 2018-12-31 NOTE — ED Notes (Signed)
Pt given lunch tray and a coke. Husband at bedside.

## 2018-12-31 NOTE — Progress Notes (Signed)
*  PRELIMINARY RESULTS* Echocardiogram 2D Echocardiogram has been performed.  Sherrie Sport 12/31/2018, 1:49 PM

## 2018-12-31 NOTE — Progress Notes (Signed)
PROGRESS NOTE  Wendy Sutton [id number] DOB: [birth year] DOA: 01/16/2019 PCP: Default, Provider, MD  Brief History   The patient is a 78 yr old woman who presented to [facility] ED with complaints of poor PO intake, fatigue, and weakness. She is unable to walk on her own. The patient reports multiple bloody bowel movements in the past 2-3 days. She was found to have a hemoglobin of 6.0 upon arrival in the ED. She has been transfused with 2 units of PRBC's. Hemoglobin is now 8.0.   The patient has a past medical history significant for breast cancer and radiation therapy.   [facility] were consulted to admit the patient for further evaluation and treatment. She was admitted by my colleague early this morning. She is being roomed in the ED awaiting a telemetry bed.  Consultants  . Cardiology  Procedures  . Transfusion of 2 units of PRBC's  Antibiotics   Anti-infectives (From admission, onward)   None    .  Subjective  The patient is very hard of hearing. No new complaints.  Objective   Vitals:  Vitals:   12/31/18 1630 12/31/18 1753  BP: 134/66 126/74  Pulse: 90 85  Resp: (!) 24 (!) 23  Temp:    SpO2: 97% 94%   Exam:  Constitutional:  . The patient is awake, alert, and oriented x 3. No acute distress. She is pale in appearance. Respiratory:  . No increased work of breathing. . No wheezes, rales, or rhonchi . No tactile fremitus Cardiovascular:  . Regular rate and rhythm . No murmurs, ectopy, or gallups. . No lateral PMI. No thrills. Abdomen:  . Abdomen is soft, non-tender, non-distended . No hernias, masses, or organomegaly . Normoactive bowel sounds.  Musculoskeletal:  . No cyanosis, clubbing, or edema Skin:  . No rashes, lesions, ulcers . palpation of skin: no induration or nodules Neurologic:  . CN 2-12 intact . Sensation all 4 extremities intact Psychiatric:  . Mental status o Mood, affect appropriate o Orientation to person, place, time  .  judgment and insight appear intact  I have personally reviewed the following:   Today's Data  . CBC, BMP .  Cardiology Data  . Echocardiogram pending.  Scheduled Meds: . furosemide  20 mg Intravenous Daily  . pantoprazole (PROTONIX) IV  40 mg Intravenous Q12H  . QUEtiapine  25 mg Oral QHS   Continuous Infusions:  Active Problems:   NSTEMI (non-ST elevated myocardial infarction) ([place])   LOS: 1 day   A & P  Non-STEMI: Likely due to demand ischemia from anemia, although troponin is significantly elevated. She is not able to be anticoagulated due to her anemia. She also cannot have an antithrombotic. Echocardiogram has been ordered and cardiology has been consulted. She is being admitted to a telemetry bed.  Symptomatic anemia: Hemoglobin was 6.0 on presentation. Now 8.0 after transfusion of 2 units packed RBC's. She has generalized fatigue and tiredness and pallor. Although Dr. Deborha Payment told Dr. Posey Pronto that the patient had not had significant GI bleed at home, the patient and husband are now admitting to me that she has had several bloody bowel movements over the course of the last 2-3 days. She is receiving Protonix, but this sounds more like a lower GI bleed. Will consult GI in the am when she is stable from a cardiological standpoint. Maintain H&H above 8. Monitor H&H.  Iron deficiency: the patient has received one dose of Feraheme. Begin oral iron tomorrow.  Hallucination: Seems resolved. Continue  Seroquel as at home.  Sjogren syndrome prevention patient is on hydroxychloroquine which I will hold for now given her significant anemia. Can resume once H&H is relatively stable.  I have seen and examined this patient myself. I have spent 35 minutes in his evaluation and care.  DVT Prophylaxis: SCD, pharmacological prophylaxis contraindicated due to Anemia CODE STATUS:DNR  Family Communication: Husband is at bedside. Disposition: tbd  Adelise Buswell, DO [facility] Direct  contact: see [email or web address]  7PM-7AM contact night coverage as above 12/31/2018, 6:02 PM  LOS: 1 day

## 2018-12-31 NOTE — ED Notes (Signed)
Offered pt breakfast tray but pt declined at this time. Tray left in rm incase pt changes mind.

## 2018-12-31 NOTE — Progress Notes (Signed)
[facility] Cardiology  SUBJECTIVE: The patient denies recurrent chest pain. She reports an approximate 1 month history of peripheral edema, advised as outpatient to try compression stockings, which provided some relief. She denies shortness of breath or palpitations.    Vitals:   12/31/18 0145 12/31/18 0202 12/31/18 0629 12/31/18 0632  BP: 122/62  126/78   Pulse: 73  78   Resp: (!) 21  (!) 23   Temp:  97.8 F (36.6 C) 97.7 F (36.5 C)   TempSrc:  Oral Oral   SpO2: 96%  91% (!) 89%  Weight:      Height:         Intake/Output Summary (Last 24 hours) at 12/31/2018 [id number] Last data filed at 01/21/2019 1944 Gross per 24 hour  Intake 519.69 ml  Output -  Net 519.69 ml      PHYSICAL EXAM  General: Elderly lady lying in bed in no acute distress HEENT:  Normocephalic and atramatic, HOH Neck:  No JVD.  Lungs: Nasal cannula in place; course bibasilar crackles, respiration-induced coughing. No wheezing. Normal effort of breathing. Heart: HRRR .2/6 systolic murmur  Abdomen: no obvious distention Msk:  Atrophy and weakness throughout. Required assistance to sit up in bed. Gait not assessed. No obvious deformity Extremities: 1+ bilateral lower extremity edema.   Neuro: Alert and oriented X 3. Psych:  Good affect, responds appropriately   LABS: Basic Metabolic Panel: Recent Labs    01/13/2019 1017 01/03/2019 1303 12/31/18 0545  NA 135  --  132*  K 2.1*  --  3.1*  CL 97*  --  99  CO2 25  --  22  GLUCOSE 75  --  74  BUN 24*  --  25*  CREATININE 0.98  --  0.92  CALCIUM 8.3*  --  7.5*  MG  --  1.9  --    Liver Function Tests: Recent Labs    01/08/2019 1017 12/31/18 0545  AST 78* 53*  ALT 18 16  ALKPHOS 59 51  BILITOT 1.1 1.8*  PROT 7.4 6.5  ALBUMIN 2.7* 2.4*   Recent Labs    01/21/2019 1017  LIPASE 45   CBC: Recent Labs    01/17/2019 1017 12/31/18 0545  WBC 16.4* 12.0*  NEUTROABS 14.3*  --   HGB 6.0* 8.0*  HCT 21.5* 25.1*  MCV 68.9* 71.7*  PLT 165 92*   Cardiac  Enzymes: No results for input(s): CKTOTAL, CKMB, CKMBINDEX, TROPONINI in the last 72 hours. BNP: Invalid input(s): POCBNP D-Dimer: No results for input(s): DDIMER in the last 72 hours. Hemoglobin A1C: No results for input(s): HGBA1C in the last 72 hours. Fasting Lipid Panel: No results for input(s): CHOL, HDL, LDLCALC, TRIG, CHOLHDL, LDLDIRECT in the last 72 hours. Thyroid Function Tests: Recent Labs    12/24/2018 1303  TSH 0.648   Anemia Panel: Recent Labs    12/31/2018 1303  VITAMINB12 848  FERRITIN 18  TIBC 312  IRON 16*  RETICCTPCT 2.1    Ct Head Wo Contrast  Result Date: 12/26/2018 CLINICAL DATA:  Confusion, weakness EXAM: CT HEAD WITHOUT CONTRAST TECHNIQUE: Contiguous axial images were obtained from the base of the skull through the vertex without intravenous contrast. COMPARISON:  MRI brain dated 07/14/2018 FINDINGS: Brain: No evidence of acute infarction, hemorrhage, hydrocephalus, extra-axial collection or mass lesion/mass effect. Subcortical white matter and periventricular small vessel ischemic changes. Vascular: Intracranial atherosclerosis. Skull: Normal. Negative for fracture or focal lesion. Sinuses/Orbits: The visualized paranasal sinuses are essentially clear. The mastoid air cells  are unopacified. Other: None. IMPRESSION: No evidence of acute intracranial abnormality. Small vessel ischemic changes. Electronically Signed   By: Julian Hy M.D.   On: 01/19/2019 12:15   Dg Chest Port 1 View  Result Date: 01/18/2019 CLINICAL DATA:  Patient with altered mental status. EXAM: PORTABLE CHEST 1 VIEW COMPARISON:  None. FINDINGS: Monitoring leads overlie the patient. Enlarged cardiac and mediastinal contours. Aortic atherosclerosis. Small bilateral pleural effusions. Bilateral interstitial pulmonary opacities. IMPRESSION: Cardiomegaly with interstitial opacities favored to represent edema. Small bilateral effusions. Electronically Signed   By: Lovey Newcomer M.D.   On:  12/31/2018 11:20     Echo Pending   ASSESSMENT AND PLAN:  Active Problems:   NSTEMI (non-ST elevated myocardial infarction) ([place])    1. Lateral STEMI, probable late presentation, with initial hs-troponin elevated to 12,952, with 1 mm of ST elevation in leads I and aVL with diagnostic Q waves. The patient denies recurrent chest pain. Not candidate for full dose anticoagulation in the setting of marked anemia and bright red blood per rectum. 2. Marked anemia, status post 2 units of PRBC. Hemoglobin initially 6.0, improved to 8.0 this morning. 3. Acute CHF, uncertain type, with BNP 1,658 with small bilateral effusions, hypoxemia. 4. Hypokalemia, initially K was 2.1; improved to 3.1 today with supplementation.  Recommendations: 1. Continue conservative management from cardiovascular perspective at this time in light of marked anemia, absence of recurrent chest pain, late presentation of lateral STEMI, where the risks of cardiac catheterization with potential PCI would likely outweigh the benefits. 2. Obtain 2D echocardiogram today 3. Continue to cycle hs-troponin 4. Recommend GI evaluation 5. Add IV Lasix 20 mg  6. Continue to supplement potassium accordingly 7. Recommend adding beta blocker at discharge. 8. Further recommendations pending results of echocardiogram   Clabe Seal, PA-C 12/31/2018 9:27 AM  Discussed with Dr. Saralyn Pilar who agrees with the above plan.

## 2018-12-31 NOTE — ED Notes (Signed)
Pt's brief and linens changed. Pt clean/dry at this time, purwick on pt. Will continue to monitor.

## 2019-01-01 DIAGNOSIS — I469 Cardiac arrest, cause unspecified: Secondary | ICD-10-CM

## 2019-01-01 DIAGNOSIS — L8995 Pressure ulcer of unspecified site, unstageable: Secondary | ICD-10-CM

## 2019-01-01 DIAGNOSIS — R451 Restlessness and agitation: Secondary | ICD-10-CM

## 2019-01-01 DIAGNOSIS — L8990 Pressure ulcer of unspecified site, unspecified stage: Secondary | ICD-10-CM | POA: Insufficient documentation

## 2019-01-01 DIAGNOSIS — I214 Non-ST elevation (NSTEMI) myocardial infarction: Secondary | ICD-10-CM

## 2019-01-01 DIAGNOSIS — E876 Hypokalemia: Secondary | ICD-10-CM

## 2019-01-01 DIAGNOSIS — K922 Gastrointestinal hemorrhage, unspecified: Secondary | ICD-10-CM

## 2019-01-01 LAB — TROPONIN I (HIGH SENSITIVITY): Troponin I (High Sensitivity): 7328 ng/L (ref <18)

## 2019-01-01 LAB — HAPTOGLOBIN: Haptoglobin: 159 mg/dL (ref 42–346)

## 2019-01-01 LAB — CBC
HCT: 28.9 % — ABNORMAL LOW (ref 36.0–46.0)
Hemoglobin: 8.6 g/dL — ABNORMAL LOW (ref 12.0–15.0)
MCH: 22.3 pg — ABNORMAL LOW (ref 26.0–34.0)
MCHC: 29.8 g/dL — ABNORMAL LOW (ref 30.0–36.0)
MCV: 75.1 fL — ABNORMAL LOW (ref 80.0–100.0)
Platelets: 98 10*3/uL — ABNORMAL LOW (ref 150–400)
RBC: 3.85 MIL/uL — ABNORMAL LOW (ref 3.87–5.11)
RDW: 24.9 % — ABNORMAL HIGH (ref 11.5–15.5)
WBC: 12.8 10*3/uL — ABNORMAL HIGH (ref 4.0–10.5)
nRBC: 0.6 % — ABNORMAL HIGH (ref 0.0–0.2)

## 2019-01-01 LAB — BASIC METABOLIC PANEL
Anion gap: 11 (ref 5–15)
BUN: 18 mg/dL (ref 8–23)
CO2: 22 mmol/L (ref 22–32)
Calcium: 7.6 mg/dL — ABNORMAL LOW (ref 8.9–10.3)
Chloride: 102 mmol/L (ref 98–111)
Creatinine, Ser: 0.83 mg/dL (ref 0.44–1.00)
GFR calc Af Amer: >60 mL/min (ref >60)
GFR calc non Af Amer: >60 mL/min (ref >60)
Glucose, Bld: 99 mg/dL (ref 70–99)
Potassium: 3.1 mmol/L — ABNORMAL LOW (ref 3.5–5.1)
Sodium: 135 mmol/L (ref 135–145)

## 2019-01-01 LAB — GLUCOSE, CAPILLARY: Glucose-Capillary: 95 mg/dL (ref 70–99)

## 2019-01-01 MED ORDER — HALOPERIDOL LACTATE 5 MG/ML IJ SOLN
2.0000 mg | Freq: Four times a day (QID) | INTRAMUSCULAR | Status: DC | PRN
  Administered 2019-01-01: 2 mg via INTRAVENOUS
  Filled 2019-01-01: qty 1

## 2019-01-01 MED ORDER — HALOPERIDOL LACTATE 5 MG/ML IJ SOLN
INTRAMUSCULAR | Status: AC
  Filled 2019-01-01: qty 1

## 2019-01-02 MED FILL — Medication: Qty: 1 | Status: AC

## 2019-01-03 LAB — ECHOCARDIOGRAM COMPLETE
Height: 63 in
Weight: 2720 oz

## 2019-01-22 NOTE — ED Provider Notes (Signed)
[facility] Department of Emergency Medicine   Code Blue CONSULT NOTE  Chief Complaint: Cardiac arrest/unresponsive   Level V Caveat: Unresponsive  History of present illness: I was contacted by the hospital for a CODE BLUE cardiac arrest upstairs and presented to the patient's bedside.   Patient recently admitted for an NSTEMI, likely had an MI in the preceding days prior to admission.  Now with resultant heart failure.  Also found to be anemic to 6.0 with concern for GI bleed, requiring transfusion. Unable to be anticoagulated or catheterized due to her anemia needing transfusion.   ROS: Unable to obtain, Level V caveat  Scheduled Meds: . furosemide  20 mg Intravenous Daily  . pantoprazole (PROTONIX) IV  40 mg Intravenous Q12H  . QUEtiapine  25 mg Oral QHS   Continuous Infusions: PRN Meds:.acetaminophen **OR** acetaminophen, haloperidol lactate, ondansetron **OR** ondansetron (ZOFRAN) IV, traZODone Past Medical History:  Diagnosis Date  . Breast cancer ([place]) 2001   right breast cancer  . Personal history of radiation therapy 2001   History reviewed. No pertinent surgical history. Social History   Socioeconomic History  . Marital status: Married    Spouse name: Not on file  . Number of children: Not on file  . Years of education: Not on file  . Highest education level: Not on file  Occupational History  . Not on file  Social Needs  . Financial resource strain: Not on file  . Food insecurity    Worry: Not on file    Inability: Not on file  . Transportation needs    Medical: Not on file    Non-medical: Not on file  Tobacco Use  . Smoking status: Never Smoker  . Smokeless tobacco: Never Used  Substance and Sexual Activity  . Alcohol use: Not Currently  . Drug use: Never  . Sexual activity: Not on file  Lifestyle  . Physical activity    Days per week: Not on file    Minutes per session: Not on file  . Stress: Not on file  Relationships  .  Social Herbalist on phone: Not on file    Gets together: Not on file    Attends religious service: Not on file    Active member of club or organization: Not on file    Attends meetings of clubs or organizations: Not on file    Relationship status: Not on file  . Intimate partner violence    Fear of current or ex partner: Not on file    Emotionally abused: Not on file    Physically abused: Not on file    Forced sexual activity: Not on file  Other Topics Concern  . Not on file  Social History Narrative  . Not on file   Allergies  Allergen Reactions  . Levonorgestrel-Ethinyl Estrad Hives and Rash    Last set of Vital Signs (not current) Vitals:   2019-01-08 0342 2019-01-08 0811  BP: 130/68 109/67  Pulse: 88 86  Resp: (!) 24 20  Temp: 98.4 F (36.9 C) 98.7 F (37.1 C)  SpO2: 94% 96%      Physical Exam  Gen: Unresponsive Eyes: Fixed and dilated Cardiovascular: pulseless  Resp: Apneic. Breath sounds equal bilaterally with bagging  Abd: Non-distended  Neuro: GCS 3, unresponsive to pain  HEENT: No blood in posterior pharynx, gag reflex absent  Neck: No crepitus  Musculoskeletal: No deformity  Skin: Warm  Procedures (when applicable, including Critical Care time): Procedure  Name: Intubation Date/Time: 2019/01/13 5:07 PM Performed by: Lilia Pro., MD Pre-anesthesia Checklist: Patient identified, Patient being monitored, Emergency Drugs available, Timeout performed and Suction available Oxygen Delivery Method: Ambu bag Preoxygenation: Pre-oxygenation with 100% oxygen Ventilation: Mask ventilation without difficulty Laryngoscope Size: Glidescope and 4 Tube size: 7.5 mm Number of attempts: 1 Airway Equipment and Method: Video-laryngoscopy Placement Confirmation: ETT inserted through vocal cords under direct vision,  CO2 detector and Breath sounds checked- equal and bilateral Comments: Patient intubated w/o RSI medications needed. Intubated on first attempt  during CPR without issue. Positive color change. Bilateral BS with bagging and appropriate oxygenation with adequate CPR.       MDM / Assessment and Plan Presented to the patient's bedside after CODE BLUE was called.  Arrived to patient's bedside, being bagged by respiratory.  Patient reportedly had a witnessed arrest, initial rhythm PEA.  CODE in progress being run by hospitalist service.  Patient intubated, see procedure note for further details.  No medications required.  Intubated successfully during CPR on first attempt, with good color change, condensation in tube, and bilateral breath sounds with bagging.  CODE continued by hospitalist service team.     Lilia Pro., MD 2019-01-13 [phone]

## 2019-01-22 NOTE — Progress Notes (Addendum)
NP Juanda Crumble made aware of patient's EKG this morning, no new orders received. Pt is alert to self, 2-3 assist to [facility]; unable to urinate with external catheter in place. No c/o chest pain, profile information provided by husband who is at bedside.  Husband reported giving wife (pt) some Tylenol around 0400, pt's husband was educated and made aware that the hospital will provide any medication for the pt while she is here in the hospital. Nursing staff will continue to monitor.

## 2019-01-22 NOTE — Progress Notes (Signed)
I responded to a code blue on this patient at roughly 1600 this afternoon. The patient has been found unresponsive by nursing. The patient was being monitored on telemetry, but was appaaretnly in PEA. The patient's husband was at bedside. Although the patient was a DNR he told nursing that he wanted "everything done". CODE BLUE was called. ACLS was initiated and the patient was intubated. She received IV epinephrine, amiodarone, sodium bicarbonate, and was shocked twice. No return of spontaneous circulation was obtained. The patient's husband agreed with cessation of efforts and the patient was pronounced dead at 16:20.

## 2019-01-22 NOTE — Progress Notes (Signed)
   18-Jan-2019 1600  Clinical Encounter Type  Visited With Family;Patient not available;Health care provider  Visit Type Code  Referral From Nurse  Consult/Referral To Chaplain  Spiritual Encounters  Spiritual Needs Emotional;Grief support  Stress Factors  Family Stress Factors Health changes;Loss;Major life changes   Chaplain received a Code Blue page for the patient. Upon arrival, the patient was receiving care from the medical team, including CPR. This chaplain maintained pastoral presence outside of the patient's room and offered silent prayer. The patient's husband was at the bedside making phone calls to loved ones. When the patient's husband emerged, this Probation officer offered emotional and spiritual support to him as he awaited word about his wife's condition and prognosis. Chaplain informed that the patient's son would arrive soon; nursing staff informed and approval granted for his arrival on the unit. Chaplain talked with the patient and learned some of their relational history. The patient and her husband have been married for 20 years. The patient's husband was understandably shaken and tearful as he shared news of the patient's recent decline and her wishes for herself at the EOL. The patient's husband expressed gratitude for the support offered. Care handed over to on-coming chaplain, Imagene Riches.

## 2019-01-22 NOTE — Progress Notes (Signed)
[facility] Cardiology  SUBJECTIVE: The patient was sleeping soundly at the time of this visit, and had apparently been awake until 4 AM this morning due to confusion. The husband states that the patient complained of upper abdominal pain/lower chest pain yesterday, which is not new for her, but did not complain of any significant chest pain.   Vitals:   12/31/18 2036 12/31/18 2203 2019/01/26 0342 01/26/19 0811  BP: 125/82 136/73 130/68 109/67  Pulse: 90 94 88 86  Resp: 20 (!) 28 (!) 24 20  Temp: 98.1 F (36.7 C) 97.6 F (36.4 C) 98.4 F (36.9 C) 98.7 F (37.1 C)  TempSrc: Oral Oral Oral Axillary  SpO2: 96% 94% 94% 96%  Weight:      Height:         Intake/Output Summary (Last 24 hours) at 26-Jan-2019 0948 Last data filed at 2019-01-26 0350 Gross per 24 hour  Intake 0 ml  Output -  Net 0 ml      PHYSICAL EXAM  General: Elderly female lying in bed in no acute distress HEENT:  Normocephalic and atramatic Neck:  No JVD.  Lungs: no audible wheezing, normal effort of breathing on room air. Heart: HRRR . 2/6 systolic murmur  Abdomen: no obvious distention Msk:  Back normal, gait not assessed. No obvious deformity. Extremities: No clubbing, cyanosis. Mild bilateral lower extremity edema.   Neuro: Sleeping Psych:  sleeping   LABS: Basic Metabolic Panel: Recent Labs    12/31/2018 1303 12/31/18 0545 12/31/18 0946 01-26-19 0632  NA  --  132*  --  135  K  --  3.1*  --  3.1*  CL  --  99  --  102  CO2  --  22  --  22  GLUCOSE  --  74  --  99  BUN  --  25*  --  18  CREATININE  --  0.92  --  0.83  CALCIUM  --  7.5*  --  7.6*  MG 1.9  --  2.1  --    Liver Function Tests: Recent Labs    01/20/2019 1017 12/31/18 0545  AST 78* 53*  ALT 18 16  ALKPHOS 59 51  BILITOT 1.1 1.8*  PROT 7.4 6.5  ALBUMIN 2.7* 2.4*   Recent Labs    01/06/2019 1017  LIPASE 45   CBC: Recent Labs    01/10/2019 1017 12/31/18 0545 2019/01/26 0632  WBC 16.4* 12.0* 12.8*  NEUTROABS 14.3*  --   --   HGB  6.0* 8.0* 8.6*  HCT 21.5* 25.1* 28.9*  MCV 68.9* 71.7* 75.1*  PLT 165 92* 98*   Cardiac Enzymes: No results for input(s): CKTOTAL, CKMB, CKMBINDEX, TROPONINI in the last 72 hours. BNP: Invalid input(s): POCBNP D-Dimer: No results for input(s): DDIMER in the last 72 hours. Hemoglobin A1C: No results for input(s): HGBA1C in the last 72 hours. Fasting Lipid Panel: No results for input(s): CHOL, HDL, LDLCALC, TRIG, CHOLHDL, LDLDIRECT in the last 72 hours. Thyroid Function Tests: Recent Labs    01/03/2019 1303  TSH 0.648   Anemia Panel: Recent Labs    01/15/2019 1303  VITAMINB12 848  FERRITIN 18  TIBC 312  IRON 16*  RETICCTPCT 2.1    Ct Head Wo Contrast  Result Date: 01/08/2019 CLINICAL DATA:  Confusion, weakness EXAM: CT HEAD WITHOUT CONTRAST TECHNIQUE: Contiguous axial images were obtained from the base of the skull through the vertex without intravenous contrast. COMPARISON:  MRI brain dated 07/14/2018 FINDINGS: Brain: No evidence of  acute infarction, hemorrhage, hydrocephalus, extra-axial collection or mass lesion/mass effect. Subcortical white matter and periventricular small vessel ischemic changes. Vascular: Intracranial atherosclerosis. Skull: Normal. Negative for fracture or focal lesion. Sinuses/Orbits: The visualized paranasal sinuses are essentially clear. The mastoid air cells are unopacified. Other: None. IMPRESSION: No evidence of acute intracranial abnormality. Small vessel ischemic changes. Electronically Signed   By: Julian Hy M.D.   On: 01/05/2019 12:15   Dg Chest Port 1 View  Result Date: 12/31/2018 CLINICAL DATA:  Patient with altered mental status. EXAM: PORTABLE CHEST 1 VIEW COMPARISON:  None. FINDINGS: Monitoring leads overlie the patient. Enlarged cardiac and mediastinal contours. Aortic atherosclerosis. Small bilateral pleural effusions. Bilateral interstitial pulmonary opacities. IMPRESSION: Cardiomegaly with interstitial opacities favored to represent  edema. Small bilateral effusions. Electronically Signed   By: Lovey Newcomer M.D.   On: 12/24/2018 11:20     Echo Pending  TELEMETRY: sinus rhythm, 90 bpm  ASSESSMENT AND PLAN:  Active Problems:   NSTEMI (non-ST elevated myocardial infarction) ([facility])   Pressure injury of skin    1. Lateral STEMI, probable late presentation, with initial hs-troponin elevated to 12,952, with 1 mm of ST elevation in leads I and aVL with diagnostic Q waves. The patient denies recurrent chest pain. Not candidate for full dose anticoagulation in the setting of marked anemia and bright red blood per rectum. 2. Marked anemia, status post 2 units of PRBC. Hemoglobin initially 6.0, improved to 8.6 this morning 3. Acute CHF, uncertain type, with BNP 1,658 with small bilateral effusions, hypoxemia. Diuresed with IV Lasix 4. Hypokalemia, initially K was 2.1; improved to 3.1 today with supplementation.   Recommendations: 1. Continue to defer full dose anticoagulation 2. Review 2D echocardiogram 3. Recommend GI evaluation and proceed with EGD if deemed medically necessary 4. Continue IV Lasix 20 mg once daily and supplement K as indicated 5. Recommend adding beta blocker at discharge.  Clabe Seal, PA-C 01-18-19 9:48 AM  Discussed with Dr. Saralyn Pilar who agrees with the above plan.

## 2019-01-22 NOTE — Progress Notes (Signed)
Patient having acute urinary retention.  Bladder scan shows 485ml of urine present.  Awaiting orders from MD

## 2019-01-22 NOTE — Code Documentation (Signed)
Patient found unresponsive by Edwin Dada and Linard Millers.  No pulse, no spontaneous breathing.  Patient on telemetry and has a rhythm.  Patient noted to be DNR.  Husband at bedside and told nursing staff to reverse DNR and start CPR.  He stated he "wanted everything done".  CPR was initiated and a code was called at 1357.    Time of DEATH 1620.  Husband at bedside.    [place] donor services determined patient was not a suitable donor.  Referral given by Katy Apo [phone]

## 2019-01-22 NOTE — Progress Notes (Addendum)
CRITICAL CARE NOTE  CC  follow up respiratory failure  SUBJECTIVE Acute cardiac arrest and CODE BLUE admitted for NSTEMI ACLD protocol started Intubated emergently ACLS for 22 mins Husband at bedside    BP 109/67 (BP Location: Left Arm)   Pulse 86   Temp 98.7 F (37.1 C) (Axillary)   Resp 20   Ht 5\' 3"  (1.6 m)   Wt 77.1 kg   SpO2 96%   BMI 30.11 kg/m    I/O last 3 completed shifts: In: 99.7 [IV Piggyback:99.7] Out: -  No intake/output data recorded.  SpO2: 96 % O2 Flow Rate (L/min): 2 L/min   SIGNIFICANT EVENTS   REVIEW OF SYSTEMS  PATIENT IS UNABLE TO PROVIDE COMPLETE REVIEW OF SYSTEMS DUE TO SEVERE CRITICAL ILLNESS   PHYSICAL EXAMINATION:  GENERAL:critically ill appearing, +resp distress EYES: Pupils dilated PULMONARY: +rhonchi, +wheezing CARDIOVASCULAR: no heart sounds  MEDICATIONS: I have reviewed all medications and confirmed regimen as documented   CULTURE RESULTS   Recent Results (from the past 240 hour(s))  SARS CORONAVIRUS 2 (TAT 6-24 HRS) Nasopharyngeal Nasopharyngeal Swab     Status: None   Collection Time: 01/15/2019  1:08 PM   Specimen: Nasopharyngeal Swab  Result Value Ref Range Status   SARS Coronavirus 2 NEGATIVE NEGATIVE Final    Comment: (NOTE) SARS-CoV-2 target nucleic acids are NOT DETECTED. The SARS-CoV-2 RNA is generally detectable in upper and lower respiratory specimens during the acute phase of infection. Negative results do not preclude SARS-CoV-2 infection, do not rule out co-infections with other pathogens, and should not be used as the sole basis for treatment or other patient management decisions. Negative results must be combined with clinical observations, patient history, and epidemiological information. The expected result is Negative. Fact Sheet for Patients: SugarRoll.be Fact Sheet for Healthcare Providers: https://www.woods-mathews.com/ This test is not yet approved or  cleared by the [place] FDA and  has been authorized for detection and/or diagnosis of SARS-CoV-2 by FDA under an Emergency Use Authorization (EUA). This EUA will remain  in effect (meaning this test can be used) for the duration of the COVID-19 declaration under Section 56 4(b)(1) of the Act, 21 U.S.C. section 360bbb-3(b)(1), unless the authorization is terminated or revoked sooner. Performed at [facility], [place] [address], [place], [place] [address]           ASSESSMENT AND PLAN SYNOPSIS   Severe ACUTE Hypoxic and Hypercapnic Respiratory Failure from acute cardiac arrest from NSTEMI with acute CHF ER doc and Hospitalist at bedside Husband notified of death at [place] Time devoted to patient care services described in this note is  32  minutes.    Corrin Parker, M.D.  [facility]  Medical Director [place] Director [facility] Cardio-Pulmonary Department

## 2019-01-22 NOTE — Significant Event (Signed)
Rapid Response Event Note  Overview: Time Called: [id number] Arrival Time: 1548 Event Type: Cardiac  Initial Focused Assessment: Arrived in patient's room just as rapid response progressed to code blue. See code blue summary. Code stopped at 16:20  Event Summary:   at      at    Outcome: Coded and expired  Event End Time: [place], [place]

## 2019-01-22 NOTE — Progress Notes (Addendum)
PROGRESS NOTE  Wendy Sutton [id number] DOB: 1940-10-19 DOA: 01/11/2019 PCP: Default, Provider, MD  Brief History   The patient is a 78 yr old woman who presented to [facility] ED with complaints of poor PO intake, fatigue, and weakness. She is unable to walk on her own. The patient reports multiple bloody bowel movements in the past 2-3 days. She was found to have a hemoglobin of 6.0 upon arrival in the ED. She has been transfused with 2 units of PRBC's. Hemoglobin is now 8.0.   The patient has a past medical history significant for breast cancer and radiation therapy.   [facility] were consulted to admit the patient for further evaluation and treatment. She was admitted by my colleague early this morning. She is being roomed in the ED awaiting a telemetry bed.  The patient was admitted to a telemetry bed. Cardiology was consulted and an echocardiogram has been ordered. She has had a very restless night and has been very agitated this morning by report, although she appears very calm for my visit this morning.  Consultants  . Cardiology  Procedures  . Transfusion of 2 units of PRBC's  Antibiotics   Anti-infectives (From admission, onward)   None     Subjective  The patient is very hard of hearing. No new complaints.  Objective   Vitals:  Vitals:   Jan 30, 2019 0342 01/30/19 0811  BP: 130/68 109/67  Pulse: 88 86  Resp: (!) 24 20  Temp: 98.4 F (36.9 C) 98.7 F (37.1 C)  SpO2: 94% 96%   Exam:  Constitutional:  . The patient is awake, alert, and confused. No acute distress. She is pale in appearance. She has been very agitated overnight and early this morning. Respiratory:  . No increased work of breathing. . No wheezes, rales, or rhonchi . No tactile fremitus Cardiovascular:  . Regular rate and rhythm . No murmurs, ectopy, or gallups. . No lateral PMI. No thrills. Abdomen:  . Abdomen is soft, non-tender, non-distended . No hernias, masses, or organomegaly .  Normoactive bowel sounds.  Musculoskeletal:  . No cyanosis, clubbing, or edema Skin:  . No rashes, lesions, ulcers . palpation of skin: no induration or nodules Neurologic:  . CN 2-12 intact . Moving all extremities .   I have personally reviewed the following:   Today's Data  . CBC, BMP .  Cardiology Data  . Echocardiogram pending.  Scheduled Meds: . furosemide  20 mg Intravenous Daily  . pantoprazole (PROTONIX) IV  40 mg Intravenous Q12H  . QUEtiapine  25 mg Oral QHS   Continuous Infusions:  Active Problems:   NSTEMI (non-ST elevated myocardial infarction) (HCC)   Pressure injury of skin   LOS: 2 days   A & P  Non-STEMI: Likely due to demand ischemia from anemia, although troponin is significantly elevated. She is not able to be anticoagulated due to her anemia. She also cannot have an antithrombotic. Echocardiogram has been ordered and cardiology has been consulted. She was admitted to a telemetry bed.   Symptomatic anemia: Hemoglobin was 6.0 on presentation. Now 8.0 after transfusion of 2 units packed RBC's. She has generalized fatigue and tiredness and pallor. Although Dr. Deborha Payment told Dr. Posey Pronto that the patient had not had significant GI bleed at home, the patient and husband are now admitting to me that she has had several bloody bowel movements over the course of the last 2-3 days. She is receiving Protonix, but this sounds more like a lower GI bleed.  Will consult GI in the am when she is stable from a cardiological standpoint. Maintain H&H above 8. Monitor H&H. Hemoglobin this morning was 8.6.  Iron deficiency: the patient has received one dose of Feraheme. Iron Oral supplementation.  Hallucination: Seems resolved. Continue Seroquel as at home.  Sjogren syndrome prevention patient is on hydroxychloroquine which I will hold for now given her significant anemia. Can resume once H&H is relatively stable.  I have seen and examined this patient myself. I have spent 32  minutes in his evaluation and care.  DVT Prophylaxis: SCD, pharmacological prophylaxis contraindicated due to Anemia CODE STATUS:DNR  Family Communication: Husband is at bedside. Disposition: tbd  Danitza Schoenfeldt, DO [facility] Direct contact: see [email or web address]  7PM-7AM contact night coverage as above 01-03-2019, 12:56 PM  LOS: 1 day

## 2019-01-22 NOTE — Death Summary Note (Addendum)
Physician Discharge Summary  Wendy Sutton [id number] DOB: 05/22/1940 DOA: 2019-01-16  PCP: Default, Provider, MD  Admit date: 01-16-2019 Discharge date: 2019/01/18   Discharge Diagnoses: Principal diagnosis is #1 Acute and subacute MI Non-STEMI Symptomatic anemia GI Bleed Iron deficiency Hallucinations/delirium Sjogren syndrome Pressure Injury of Skin CHF: Type and severity clinically undetermined.  Discharge Condition: Deceased Disposition: Deceased  Filed Weights   2019-01-16 1014  Weight: 77.1 kg    History of present illness:   Wendy Sutton is a 78 y.o. female with Past medical history of Sjogren's syndrome, neuropathy, hallucination. Patient presented with complaints of 1 week of poor p.o. intake fatigue and tiredness. History was taken from husband.  Patient does have some cognitive decline over last 1 year but since last 1 week patient is more confused. There was also significant weakness and pallor reported by the husband. No diarrhea reported no constipation no nausea no vomiting.  No fever no chills.  No change in the medication reported. No melena no BRBPR reported. Patient does not take any ibuprofen Aleve naproxen. Patient denies any chest pain chest tightness chest heaviness.  No shortness of breath as well when laying flat.   ED Course: Presented with above complaint.  Hemoglobin was significantly low.  Patient was started on 2 PRBC and was referred for further admission.  Hospital Course:  The patient is a 78 yr old woman who presented to [facility] ED with complaints of poor PO intake, fatigue, and weakness. She is unable to walk on her own. The patient reports multiple bloody bowel movements in the past 2-3 days. She was found to have a hemoglobin of 6.0 upon arrival in the ED. She has been transfused with 2 units of PRBC's. Hemoglobin is now 8.0.    The patient has a past medical history significant for breast cancer and radiation therapy.    [facility]ists were consulted to admit the patient for further evaluation and treatment. She was admitted by my colleague early this morning. She is being roomed in the ED awaiting a telemetry bed.   The patient was admitted to a telemetry bed. Cardiology was consulted and an echocardiogram has been ordered. She has had a very restless night and has been very agitated this morning by report, although she appeared very calm for my visit this morning.  I responded to a code blue on this patient at roughly 1600 this afternoon. The patient has been found unresponsive by nursing. The patient was being monitored on telemetry, but was appaaretnly in PEA. The patient's husband was at bedside. Although the patient was a DNR he told nursing that he wanted "everything done". CODE BLUE was called. ACLS was initiated and the patient was intubated. She received IV epinephrine, amiodarone, sodium bicarbonate, and was shocked twice. No return of spontaneous circulation was obtained. The patient's husband agreed with cessation of efforts and the patient was pronounced dead at 16:20.  Today's assessment: Please see progress note dated 01-18-19 for physical exam on the date of death. Vitals:  Vitals:   2019/01/18 0342 2019-01-18 0811  BP: 130/68 109/67  Pulse: 88 86  Resp: (!) 24 20  Temp: 98.4 F (36.9 C) 98.7 F (37.1 C)  SpO2: 94% 96%     Allergies  Allergen Reactions   Levonorgestrel-Ethinyl Estrad Hives and Rash    The results of significant diagnostics from this hospitalization (including imaging, microbiology, ancillary and laboratory) are listed below for reference.    Significant Diagnostic Studies: Ct Head Wo  Contrast  Result Date: 12/23/2018 CLINICAL DATA:  Confusion, weakness EXAM: CT HEAD WITHOUT CONTRAST TECHNIQUE: Contiguous axial images were obtained from the base of the skull through the vertex without intravenous contrast. COMPARISON:  MRI brain dated 07/14/2018 FINDINGS: Brain: No evidence  of acute infarction, hemorrhage, hydrocephalus, extra-axial collection or mass lesion/mass effect. Subcortical white matter and periventricular small vessel ischemic changes. Vascular: Intracranial atherosclerosis. Skull: Normal. Negative for fracture or focal lesion. Sinuses/Orbits: The visualized paranasal sinuses are essentially clear. The mastoid air cells are unopacified. Other: None. IMPRESSION: No evidence of acute intracranial abnormality. Small vessel ischemic changes. Electronically Signed   By: Julian Hy M.D.   On: 01/04/2019 12:15   Dg Chest Port 1 View  Result Date: 12/31/2018 CLINICAL DATA:  Patient with altered mental status. EXAM: PORTABLE CHEST 1 VIEW COMPARISON:  None. FINDINGS: Monitoring leads overlie the patient. Enlarged cardiac and mediastinal contours. Aortic atherosclerosis. Small bilateral pleural effusions. Bilateral interstitial pulmonary opacities. IMPRESSION: Cardiomegaly with interstitial opacities favored to represent edema. Small bilateral effusions. Electronically Signed   By: Lovey Newcomer M.D.   On: 01/21/2019 11:20    Microbiology: No results found for this or any previous visit (from the past 240 hour(s)).   Labs: Basic Metabolic Panel: No results for input(s): NA, K, CL, CO2, GLUCOSE, BUN, CREATININE, CALCIUM, MG, PHOS in the last 168 hours. Liver Function Tests: No results for input(s): AST, ALT, ALKPHOS, BILITOT, PROT, ALBUMIN in the last 168 hours. No results for input(s): LIPASE, AMYLASE in the last 168 hours. No results for input(s): AMMONIA in the last 168 hours. CBC: No results for input(s): WBC, NEUTROABS, HGB, HCT, MCV, PLT in the last 168 hours. Cardiac Enzymes: No results for input(s): CKTOTAL, CKMB, CKMBINDEX, TROPONINI in the last 168 hours. BNP: BNP (last 3 results) Recent Labs    01/18/2019 1017  BNP 1,658.0*    ProBNP (last 3 results) No results for input(s): PROBNP in the last 8760 hours.  CBG: No results for input(s):  GLUCAP in the last 168 hours.  Active Problems:   NSTEMI (non-ST elevated myocardial infarction) (HCC)   Pressure injury of skin   Time coordinating discharge: 38 minutes.  Signed:        Kimbria Camposano, DO [facility]  01/10/2019, 12:59 PM

## 2019-01-22 DEATH — deceased

## 2021-01-09 IMAGING — CT CT HEAD W/O CM
3 series · 16 of 46 positions shown, 19 images · non-contrast
Comparison: MRI brain dated 07/14/2018

CLINICAL DATA: Confusion, weakness

EXAM:
CT HEAD WITHOUT CONTRAST
TECHNIQUE: Contiguous axial images were obtained from the base of the skull
through the vertex without intravenous contrast.

[Series 2: head wo · axial · 0.39mm/px · z∈[-91,+29]mm · 10 of 29 slices shown, 13 images]
[im 3/29  brain]
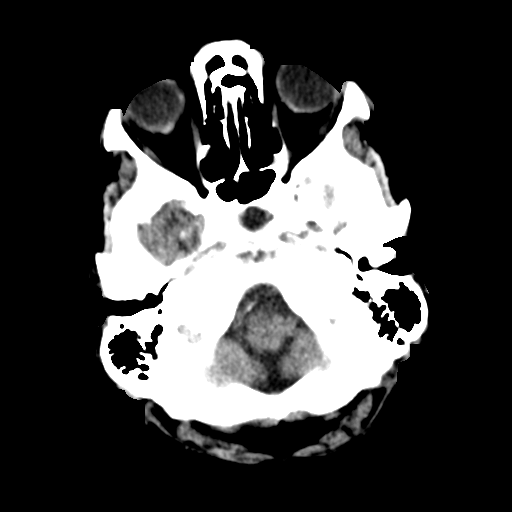
[im 3/29  bone]
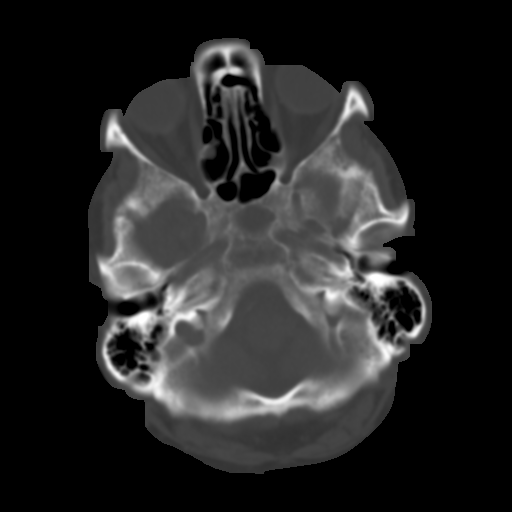
[im 6/29  brain]
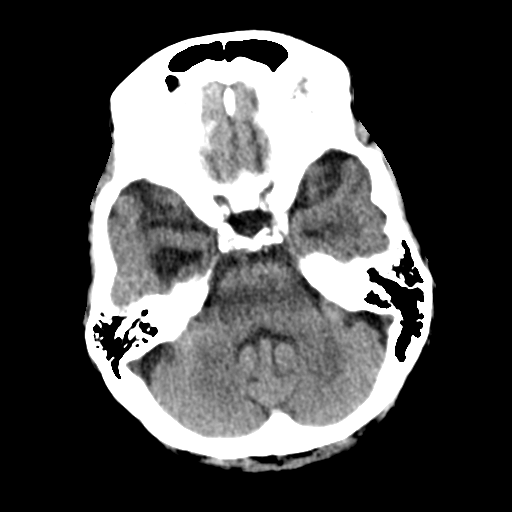
[im 8/29  brain]
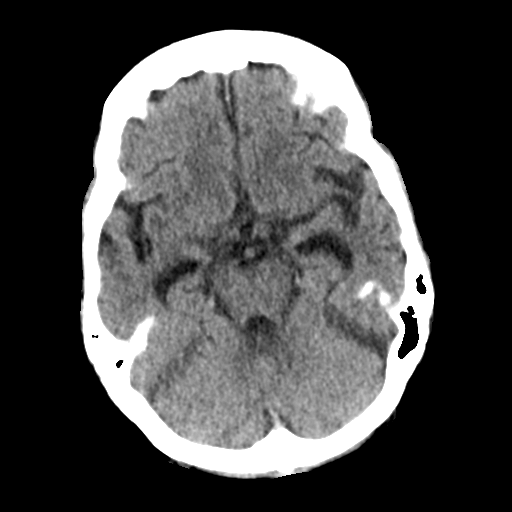
[im 11/29  brain]
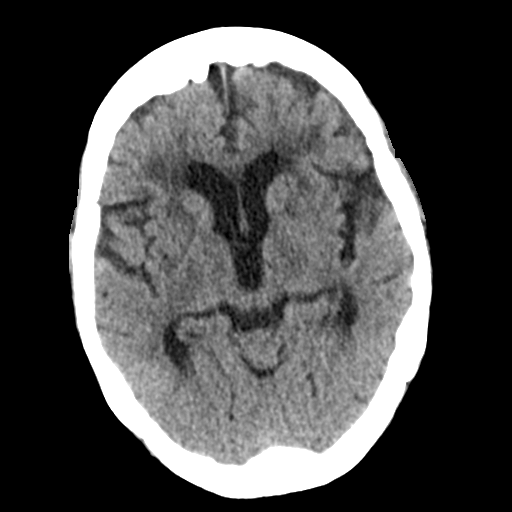
[im 14/29  brain]
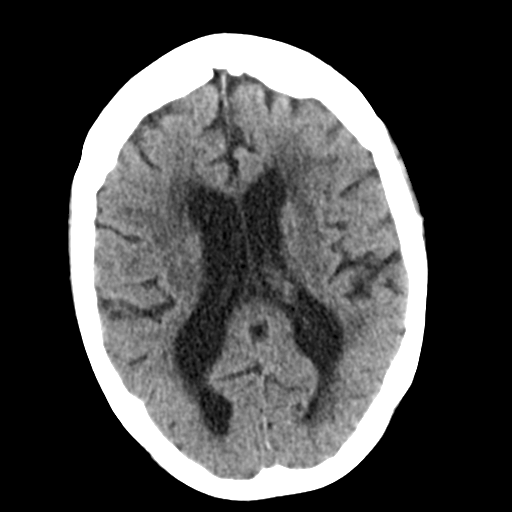
[im 14/29  bone]
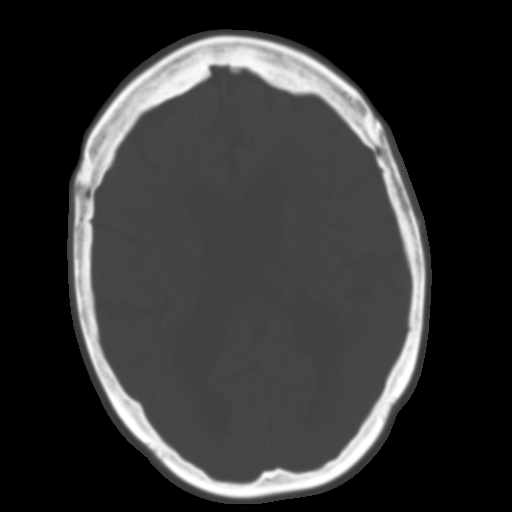
[im 16/29  brain]
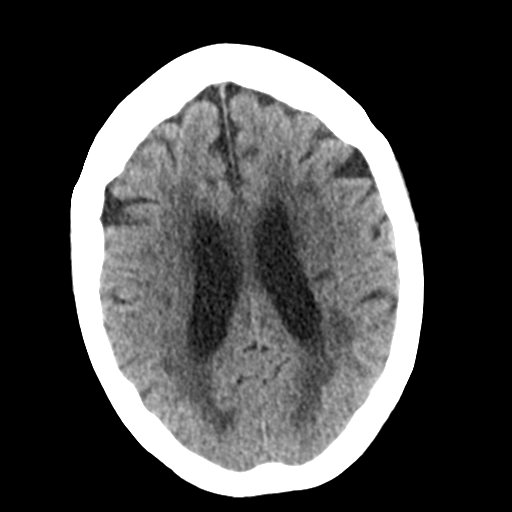
[im 19/29  brain]
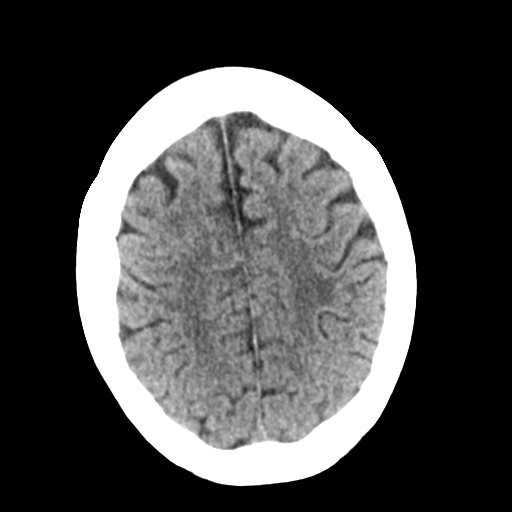
[im 22/29  brain]
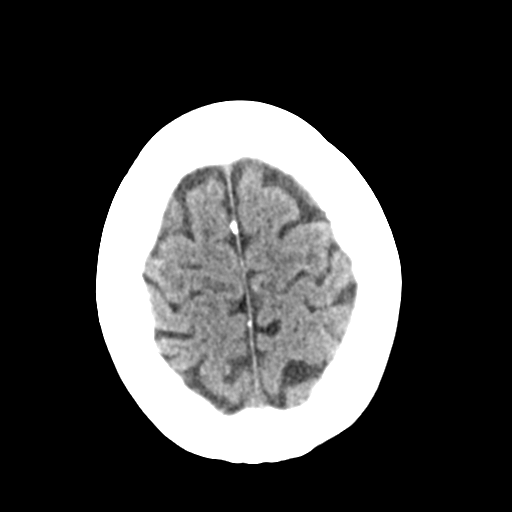
[im 24/29  brain]
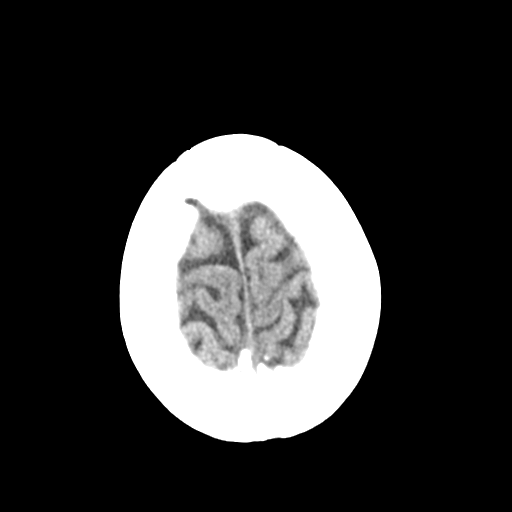
[im 24/29  bone]
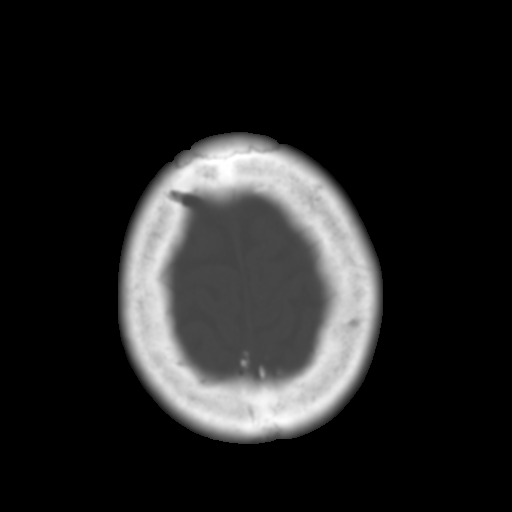
[im 27/29  brain]
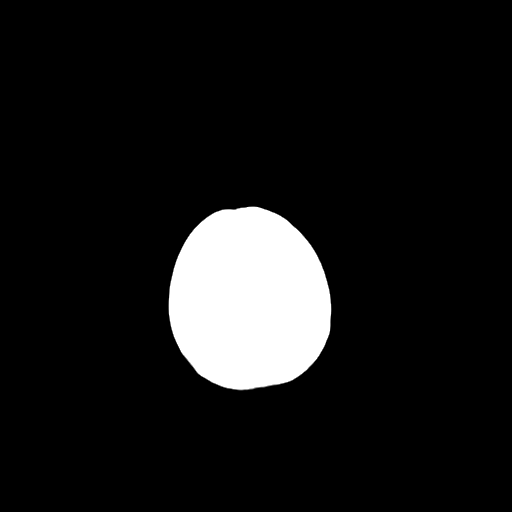

[Series 4: coronal soft tissue · coronal · 0.29mm/px · 3 of 63 slices shown]
[im 21/63  brain]
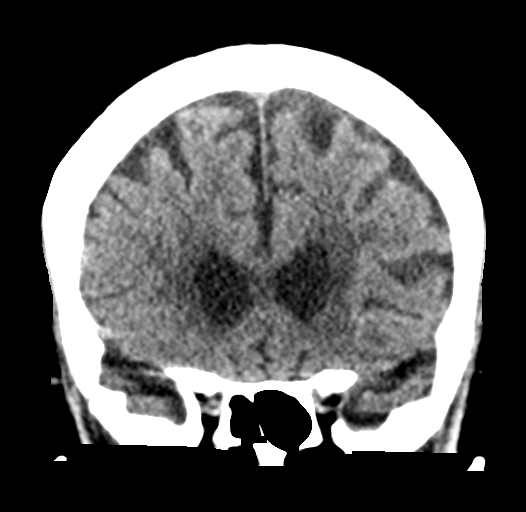
[im 28/63  brain]
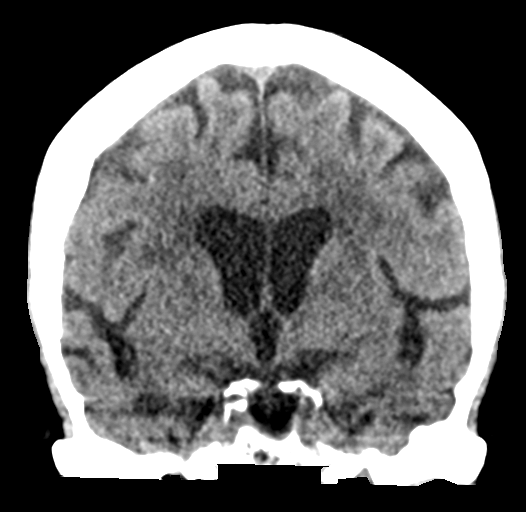
[im 35/63  brain]
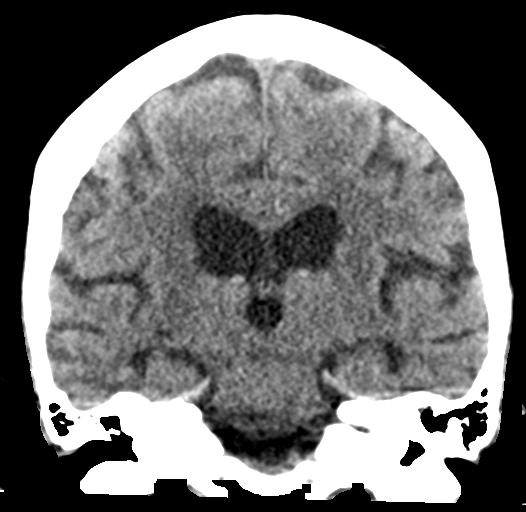

[Series 5: sagittal soft tissue · sagittal · 0.29mm/px · 3 of 51 slices shown]
[im 17/51  brain]
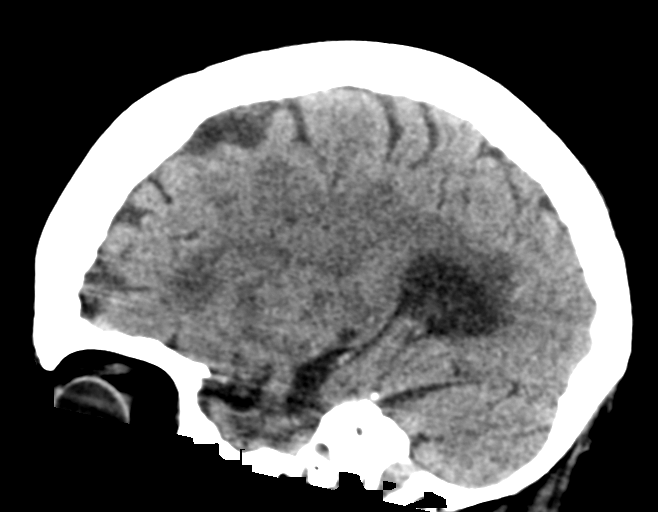
[im 26/51  brain]
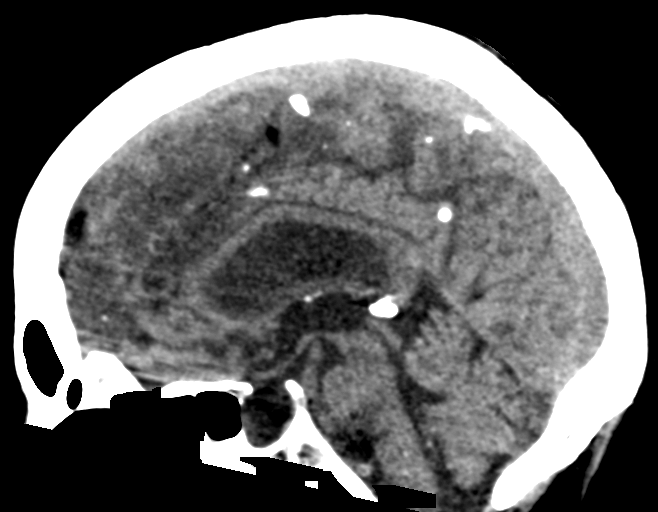
[im 34/51  brain]
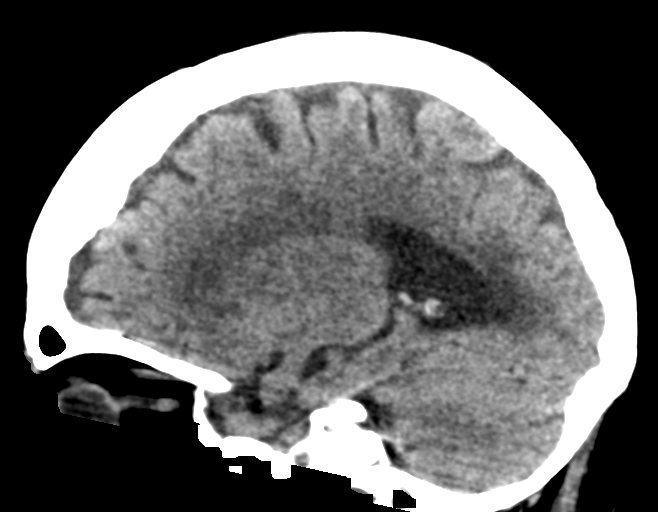

[16 of 46 positions shown; findings below may reference images not displayed]

FINDINGS: Brain: No evidence of acute infarction, hemorrhage, hydrocephalus,
extra-axial collection or mass lesion/mass effect.

Subcortical white matter and periventricular small vessel ischemic
changes.

Vascular: Intracranial atherosclerosis.

Skull: Normal. Negative for fracture or focal lesion.

Sinuses/Orbits: The visualized paranasal sinuses are essentially
clear. The mastoid air cells are unopacified.

Other: None.
IMPRESSION: No evidence of acute intracranial abnormality.

Small vessel ischemic changes.
